# Patient Record
Sex: Female | Born: 1995 | Race: White | Hispanic: No | Marital: Single | Smoking: Never smoker
Health system: Southern US, Community
[De-identification: ages and names within clinical notes are randomized; demographics above are authoritative.]

## PROBLEM LIST (undated history)

## (undated) DIAGNOSIS — R1013 Epigastric pain: Secondary | ICD-10-CM

## (undated) DIAGNOSIS — F419 Anxiety disorder, unspecified: Secondary | ICD-10-CM

## (undated) DIAGNOSIS — J302 Other seasonal allergic rhinitis: Secondary | ICD-10-CM

## (undated) DIAGNOSIS — F444 Conversion disorder with motor symptom or deficit: Secondary | ICD-10-CM

## (undated) DIAGNOSIS — E039 Hypothyroidism, unspecified: Secondary | ICD-10-CM

## (undated) DIAGNOSIS — Z973 Presence of spectacles and contact lenses: Secondary | ICD-10-CM

## (undated) DIAGNOSIS — F32A Depression, unspecified: Secondary | ICD-10-CM

## (undated) DIAGNOSIS — R061 Stridor: Secondary | ICD-10-CM

## (undated) DIAGNOSIS — K8050 Calculus of bile duct without cholangitis or cholecystitis without obstruction: Secondary | ICD-10-CM

## (undated) DIAGNOSIS — K2970 Gastritis, unspecified, without bleeding: Secondary | ICD-10-CM

## (undated) DIAGNOSIS — F329 Major depressive disorder, single episode, unspecified: Secondary | ICD-10-CM

## (undated) HISTORY — DX: Major depressive disorder, single episode, unspecified: F32.9

## (undated) HISTORY — DX: Depression, unspecified: F32.A

## (undated) HISTORY — DX: Epigastric pain: R10.13

## (undated) HISTORY — DX: Stridor: R06.1

## (undated) HISTORY — PX: WISDOM TOOTH EXTRACTION: SHX21

## (undated) HISTORY — DX: Gastritis, unspecified, without bleeding: K29.70

## (undated) HISTORY — DX: Calculus of bile duct without cholangitis or cholecystitis without obstruction: K80.50

## (undated) HISTORY — DX: Conversion disorder with motor symptom or deficit: F44.4

## (undated) SURGERY — LAPAROSCOPIC CHOLECYSTECTOMY WITH INTRAOPERATIVE CHOLANGIOGRAM
Anesthesia: General

---
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
## 2015-03-22 ENCOUNTER — Encounter: Payer: Self-pay | Admitting: Emergency Medicine

## 2015-03-22 ENCOUNTER — Emergency Department
Admission: EM | Admit: 2015-03-22 | Discharge: 2015-03-23 | Disposition: A | Discharge status: Home / Self Care | Payer: BC Managed Care – PPO | Attending: Emergency Medicine | Admitting: Emergency Medicine

## 2015-03-22 ENCOUNTER — Emergency Department: Payer: BC Managed Care – PPO

## 2015-03-22 DIAGNOSIS — Z3202 Encounter for pregnancy test, result negative: Secondary | ICD-10-CM | POA: Diagnosis not present

## 2015-03-22 DIAGNOSIS — R1011 Right upper quadrant pain: Secondary | ICD-10-CM | POA: Diagnosis not present

## 2015-03-22 LAB — COMPREHENSIVE METABOLIC PANEL
ALBUMIN: 3.9 g/dL (ref 3.5–5.0)
ALK PHOS: 46 U/L (ref 38–126)
ALT: 14 U/L (ref 14–54)
ANION GAP: 10 (ref 5–15)
AST: 15 U/L (ref 15–41)
BUN: 13 mg/dL (ref 6–20)
CALCIUM: 9.4 mg/dL (ref 8.9–10.3)
CHLORIDE: 102 mmol/L (ref 101–111)
CO2: 25 mmol/L (ref 22–32)
CREATININE: 0.74 mg/dL (ref 0.44–1.00)
GFR calc Af Amer: >60 mL/min (ref >60)
GFR calc non Af Amer: >60 mL/min (ref >60)
GLUCOSE: 91 mg/dL (ref 65–99)
Potassium: 4 mmol/L (ref 3.5–5.1)
SODIUM: 137 mmol/L (ref 135–145)
Total Bilirubin: 0.5 mg/dL (ref 0.3–1.2)
Total Protein: 8 g/dL (ref 6.5–8.1)

## 2015-03-22 LAB — CBC
HCT: 39.7 % (ref 35.0–47.0)
HEMOGLOBIN: 13 g/dL (ref 12.0–16.0)
MCH: 28.2 pg (ref 26.0–34.0)
MCHC: 32.8 g/dL (ref 32.0–36.0)
MCV: 85.9 fL (ref 80.0–100.0)
Platelets: 335 10*3/uL (ref 150–440)
RBC: 4.62 MIL/uL (ref 3.80–5.20)
RDW: 13 % (ref 11.5–14.5)
WBC: 11.8 10*3/uL — ABNORMAL HIGH (ref 3.6–11.0)

## 2015-03-22 LAB — POCT PREGNANCY, URINE: PREG TEST UR: NEGATIVE

## 2015-03-22 LAB — URINALYSIS COMPLETE WITH MICROSCOPIC (ARMC ONLY)
Bilirubin Urine: NEGATIVE
Glucose, UA: NEGATIVE mg/dL
KETONES UR: NEGATIVE mg/dL
Nitrite: NEGATIVE
PH: 7 (ref 5.0–8.0)
PROTEIN: NEGATIVE mg/dL
Specific Gravity, Urine: 1.014 (ref 1.005–1.030)

## 2015-03-22 LAB — LIPASE, BLOOD: LIPASE: 27 U/L (ref 11–51)

## 2015-03-22 MED ORDER — METOCLOPRAMIDE HCL 5 MG/ML IJ SOLN
5.0000 mg | Freq: Once | INTRAMUSCULAR | Status: AC
  Administered 2015-03-22: 5 mg via INTRAVENOUS
  Filled 2015-03-22: qty 2

## 2015-03-22 MED ORDER — SODIUM CHLORIDE 0.9 % IV BOLUS (SEPSIS)
500.0000 mL | Freq: Once | INTRAVENOUS | Status: AC
  Administered 2015-03-22: 500 mL via INTRAVENOUS

## 2015-03-22 MED ORDER — IOHEXOL 240 MG/ML SOLN
25.0000 mL | Freq: Once | INTRAMUSCULAR | Status: AC | PRN
  Administered 2015-03-22: 25 mL via ORAL

## 2015-03-22 MED ORDER — HYDROMORPHONE HCL 1 MG/ML IJ SOLN
0.5000 mg | INTRAMUSCULAR | Status: DC | PRN
  Administered 2015-03-22: 0.5 mg via INTRAVENOUS
  Filled 2015-03-22: qty 1

## 2015-03-22 NOTE — ED Notes (Signed)
RUQ abd pain x3 hours last eaten approx 2 hour prior to pain , Nausea, denies urinary symptoms

## 2015-03-22 NOTE — ED Provider Notes (Signed)
[facility] Emergency Department Provider Note  ____________________________________________  Time seen: 2135  I have reviewed the triage vital signs and the nursing notes.  History by:  Patient  HISTORY  Chief Complaint Abdominal Pain     HPI Karen Chandler is a 20 y.o. female who reports she began to have pain in her right upper quadrant at 3 PM today. The pain has been fairly steady. She denies a nausea, vomiting, or diarrhea. She denies having had symptoms like this before. She denies any trauma and she does not have any suspicions for what may be causing this pain. She appears uncomfortable.    History reviewed. No pertinent past medical history.  There are no active problems to display for this patient.   Past Surgical History  Procedure Laterality Date  . Appendectomy      No current outpatient prescriptions on file.  Allergies Review of patient's allergies indicates no known allergies.  No family history on file.  Social History Social History  Substance Use Topics  . Smoking status: Never Smoker   . Smokeless tobacco: None  . Alcohol Use: No    Review of Systems  Constitutional: Negative for fever/chills. ENT: Negative for congestion. Cardiovascular: Negative for chest pain. Respiratory: Negative for cough. Gastrointestinal: Positive for right upper quadrant abdominal pain. See history of present illness Genitourinary: Negative for dysuria. Musculoskeletal: No back pain. Skin: Negative for rash. Neurological: Negative for headache or focal weakness   10-point ROS otherwise negative.  ____________________________________________   PHYSICAL EXAM:  VITAL SIGNS: ED Triage Vitals  Enc Vitals Group     BP 03/22/15 1808 136/81 mmHg     Pulse Rate 03/22/15 1808 88     Resp 03/22/15 1808 18     Temp 03/22/15 1808 98.3 F (36.8 C)     Temp Source 03/22/15 1808 Oral     SpO2 03/22/15 1808 100 %     Weight 03/22/15  1808 210 lb (95.255 kg)     Height 03/22/15 1808 5\' 6"  (1.676 m)     Head Cir --      Peak Flow --      Pain Score 03/22/15 1809 8     Pain Loc --      Pain Edu? --      Excl. in [place]? --     Constitutional:  Alert and oriented. Well appearing and in no distress. ENT   Head: Normocephalic and atraumatic.   Nose: No congestion/rhinnorhea.       Mouth: No erythema, no swelling   Cardiovascular: Normal rate, regular rhythm, no murmur noted Respiratory:  Normal respiratory effort, no tachypnea.    Breath sounds are clear and equal bilaterally.  Gastrointestinal: Tenderness, right upper quadrant. No distention. Normal bowel sounds. Back: No muscle spasm, no tenderness, no CVA tenderness. Musculoskeletal: No deformity noted. Nontender with normal range of motion in all extremities.  No noted edema. Neurologic:  Communicative. Normal appearing spontaneous movement in all 4 extremities. No gross focal neurologic deficits are appreciated.  Skin:  Skin is warm, dry. No rash noted. Psychiatric: Mood and affect are normal. Speech and behavior are normal.  ____________________________________________    LABS (pertinent positives/negatives)  Labs Reviewed  CBC - Abnormal; Notable for the following:    WBC 11.8 (*)    All other components within normal limits  URINALYSIS COMPLETEWITH MICROSCOPIC ([facility] ONLY) - Abnormal; Notable for the following:    Color, Urine YELLOW (*)    APPearance HAZY (*)  Hgb urine dipstick 1+ (*)    Leukocytes, UA 2+ (*)    Bacteria, UA RARE (*)    Squamous Epithelial / LPF 6-30 (*)    All other components within normal limits  LIPASE, BLOOD  COMPREHENSIVE METABOLIC PANEL  POC URINE PREG, ED  POCT PREGNANCY, URINE     ____________________________________________   EKG    ____________________________________________    RADIOLOGY  Ultrasound, right upper quadrant:  FINDINGS: Gallbladder:  No gallstones or wall thickening visualized. No  sonographic Murphy sign noted by sonographer.  Common bile duct:  Diameter: 3 mm  Liver:  No focal lesion identified. Within normal limits in parenchymal echogenicity.  IMPRESSION: Unremarkable right upper quadrant ultrasound.   ____________________________________________   PROCEDURES    ____________________________________________   INITIAL IMPRESSION / ASSESSMENT AND PLAN / ED COURSE  Pertinent labs & imaging results that were available during my care of the patient were reviewed by me and considered in my medical decision making (see chart for details).  Pleasant 20 year old female who appears uncomfortable, but attempting to be stoic. We will treat her pain and obtain an ultrasound of the right upper quadrant to evaluate for biliary disease.  ----------------------------------------- 11:15 PM on 03/22/2015 -----------------------------------------  Ultrasound is negative. No gallstones. Focal lesions.  At this time, on reexam, the patient still appears uncomfortable. She reports her pain is 7 out of 10. The pain appears little bit episodic. Given her level of discomfort, I believe a CT scan of her abdomen and pelvis is appropriate. I will order it and asked my colleague, Dr. Corky Downs to follow-up on the CT scan and reevaluate the patient.  ____________________________________________   FINAL CLINICAL IMPRESSION(S) / ED DIAGNOSES  Final diagnoses:  Abdominal pain, right upper quadrant      Ahmed Prima, MD 03/22/15 2329

## 2015-03-23 ENCOUNTER — Emergency Department: Payer: BC Managed Care – PPO

## 2015-03-23 MED ORDER — IOHEXOL 300 MG/ML  SOLN
100.0000 mL | Freq: Once | INTRAMUSCULAR | Status: AC | PRN
  Administered 2015-03-23: 100 mL via INTRAVENOUS

## 2015-03-23 MED ORDER — ONDANSETRON HCL 4 MG/2ML IJ SOLN
INTRAMUSCULAR | Status: AC
  Administered 2015-03-23: 4 mg via INTRAVENOUS
  Filled 2015-03-23: qty 2

## 2015-03-23 MED ORDER — ONDANSETRON HCL 4 MG/2ML IJ SOLN
4.0000 mg | Freq: Once | INTRAMUSCULAR | Status: AC
  Administered 2015-03-23: 4 mg via INTRAVENOUS

## 2015-03-23 NOTE — ED Notes (Signed)
Pt discharged to home.  Family member driving.  Discharge instructions reviewed.  Verbalized understanding.  No questions or concerns at this time.  Teach back verified.  Pt in NAD.  No items left in ED.   

## 2015-03-23 NOTE — Discharge Instructions (Signed)

## 2015-03-23 NOTE — ED Provider Notes (Signed)
Received patient in signout from Dr. Thomasene Lot who asked me to follow-up on the CT scan and if normal likely okay for discharge.  CT head and pelvis is unremarkable besides some moderate constipation which I discussed with the patient and her father. She is well-appearing and does report some improvement in her discomfort. Her vitals are unremarkable .We agreed that discharge home with strict return precautions is appropriate.  Lavonia Drafts, MD 03/23/15 [phone]

## 2015-03-24 LAB — URINE CULTURE

## 2015-04-01 ENCOUNTER — Other Ambulatory Visit (HOSPITAL_COMMUNITY): Payer: Self-pay | Admitting: Family Medicine

## 2015-04-01 DIAGNOSIS — R1011 Right upper quadrant pain: Secondary | ICD-10-CM

## 2015-04-03 ENCOUNTER — Ambulatory Visit
Admission: RE | Admit: 2015-04-03 | Discharge: 2015-04-03 | Disposition: A | Discharge status: Home / Self Care | Payer: BC Managed Care – PPO | Source: Ambulatory Visit | Attending: Family Medicine | Admitting: Family Medicine

## 2015-04-03 DIAGNOSIS — R110 Nausea: Secondary | ICD-10-CM | POA: Diagnosis not present

## 2015-04-03 DIAGNOSIS — R1011 Right upper quadrant pain: Secondary | ICD-10-CM | POA: Insufficient documentation

## 2015-04-03 DIAGNOSIS — R42 Dizziness and giddiness: Secondary | ICD-10-CM | POA: Diagnosis not present

## 2015-04-03 MED ORDER — TECHNETIUM TC 99M MEBROFENIN IV KIT
5.0000 | PACK | Freq: Once | INTRAVENOUS | Status: AC | PRN
  Administered 2015-04-03: 4.806 via INTRAVENOUS

## 2015-04-03 MED ORDER — SINCALIDE 5 MCG IJ SOLR
0.0200 ug/kg | Freq: Once | INTRAMUSCULAR | Status: AC
  Administered 2015-04-03: 1.9 ug via INTRAVENOUS

## 2015-04-11 ENCOUNTER — Encounter: Payer: Self-pay | Admitting: Surgery

## 2015-04-11 ENCOUNTER — Ambulatory Visit (INDEPENDENT_AMBULATORY_CARE_PROVIDER_SITE_OTHER): Payer: BC Managed Care – PPO | Admitting: Surgery

## 2015-04-11 ENCOUNTER — Other Ambulatory Visit: Payer: Self-pay

## 2015-04-11 VITALS — BP 141/90 | HR 95 | Temp 98.4°F | Ht 66.0 in | Wt 215.0 lb

## 2015-04-11 DIAGNOSIS — R1010 Upper abdominal pain, unspecified: Secondary | ICD-10-CM | POA: Diagnosis not present

## 2015-04-11 DIAGNOSIS — R1011 Right upper quadrant pain: Principal | ICD-10-CM

## 2015-04-11 DIAGNOSIS — G8929 Other chronic pain: Secondary | ICD-10-CM

## 2015-04-11 NOTE — Patient Instructions (Signed)
Please go to your Gastroenterologist appointment so we could rule out what is going on.

## 2015-04-11 NOTE — Progress Notes (Signed)
Surgical Consultation  04/11/2015  Karen Chandler is an 20 y.o. female.   CC: Right upper quadrant pain  HPI: This patient with 2 weeks of right upper quadrant pain and epigastric pain it is nearly constant but is worsened by eating it radiates through to her back and shoulder blade she has nausea but no emesis and has had normal bowel movements and no fevers or chills she's had never had an episode like this before denies jaundice or acholic stools  Patient had a HIDA scan and CCK reproduced her pain  Past Medical History  Diagnosis Date  . Depression     Past Surgical History  Procedure Laterality Date  . Appendectomy  02/22/2013    Dr. Burt Knack    Family History  Problem Relation Age of Onset  . Hypertension Mother   . Anxiety disorder Mother   . Depression Mother   . Multiple sclerosis Father   . Hypertension Maternal Grandmother   . Heart disease Maternal Grandmother   . Heart disease Maternal Grandfather   . Diabetes Paternal Grandfather     Social History:  reports that she has never smoked. She does not have any smokeless tobacco history on file. She reports that she does not drink alcohol or use illicit drugs.  Allergies: No Known Allergies  Medications reviewed.   Review of Systems:   Review of Systems  Constitutional: Negative for fever and chills.  HENT: Negative.   Eyes: Negative.   Respiratory: Negative.   Cardiovascular: Negative.   Gastrointestinal: Positive for nausea and abdominal pain. Negative for heartburn, vomiting, diarrhea, constipation, blood in stool and melena.       RUQ and back pain  Genitourinary: Negative.   Musculoskeletal: Negative.   Skin: Negative.   Neurological: Negative.   Endo/Heme/Allergies: Negative.   Psychiatric/Behavioral: Negative.      Physical Exam:  BP 141/90 mmHg  Pulse 95  Temp(Src) 98.4 F (36.9 C) (Oral)  Ht 5\' 6"  (1.676 m)  Wt 215 lb (97.523 kg)  BMI 34.72 kg/m2  LMP 03/24/2015  Physical Exam   Constitutional: She is oriented to person, place, and time and well-developed, well-nourished, and in no distress. No distress.  HENT:  Head: Normocephalic and atraumatic.  Eyes: Pupils are equal, round, and reactive to light. Right eye exhibits no discharge. Left eye exhibits no discharge. No scleral icterus.  Neck: Normal range of motion. Neck supple.  Cardiovascular: Normal rate, regular rhythm and normal heart sounds.   Pulmonary/Chest: Effort normal and breath sounds normal. No respiratory distress. She has no wheezes. She has no rales.  Abdominal: Soft. She exhibits no distension. There is no tenderness. There is no rebound and no guarding.  Musculoskeletal: Normal range of motion. She exhibits no edema.  Lymphadenopathy:    She has no cervical adenopathy.  Neurological: She is alert and oriented to person, place, and time.  Skin: Skin is warm and dry. She is not diaphoretic.  Psychiatric: Mood and affect normal.  Vitals reviewed.     No results found for this or any previous visit (from the past 48 hour(s)). No results found.  Assessment/Plan:  Is personally reviewed she has an ejection fraction of 41% but the symptoms were reproduced with CCK which is very suggestive of this being biliary colic and chronic cholecystitis. Liver in the absence of gallstones and with a normal HIDA scan ejection fraction in a young patient I believe that GI workup is warranted as well prior to offering surgery. This was discussed  with she and her father and I will arrange for GI consult and see them back and consider surgical intervention in the near future  Florene Glen, MD, FACS

## 2015-04-12 ENCOUNTER — Encounter: Payer: Self-pay | Admitting: *Deleted

## 2015-04-12 ENCOUNTER — Telehealth: Payer: Self-pay | Admitting: Surgery

## 2015-04-12 NOTE — Telephone Encounter (Signed)
Pt advised of pre op date/time and sx date. Sx: 04/25/15 with Dr Maeola Sarah Cholecystectomy. Pre op: 04/19/15 between 1-5:00pm--Phone.

## 2015-04-16 NOTE — Discharge Instructions (Signed)

## 2015-04-18 ENCOUNTER — Ambulatory Visit: Payer: BC Managed Care – PPO | Admitting: Anesthesiology

## 2015-04-18 ENCOUNTER — Ambulatory Visit
Admission: RE | Admit: 2015-04-18 | Discharge: 2015-04-18 | Disposition: A | Discharge status: Home / Self Care | Payer: BC Managed Care – PPO | Source: Ambulatory Visit | Attending: Gastroenterology | Admitting: Gastroenterology

## 2015-04-18 ENCOUNTER — Encounter
Admission: RE | Disposition: A | Discharge status: Home / Self Care | Payer: Self-pay | Source: Ambulatory Visit | Attending: Gastroenterology

## 2015-04-18 ENCOUNTER — Other Ambulatory Visit: Payer: Self-pay | Admitting: Gastroenterology

## 2015-04-18 DIAGNOSIS — F329 Major depressive disorder, single episode, unspecified: Secondary | ICD-10-CM | POA: Diagnosis not present

## 2015-04-18 DIAGNOSIS — K2950 Unspecified chronic gastritis without bleeding: Secondary | ICD-10-CM | POA: Insufficient documentation

## 2015-04-18 DIAGNOSIS — Z79899 Other long term (current) drug therapy: Secondary | ICD-10-CM | POA: Diagnosis not present

## 2015-04-18 DIAGNOSIS — R1013 Epigastric pain: Secondary | ICD-10-CM | POA: Insufficient documentation

## 2015-04-18 DIAGNOSIS — Z818 Family history of other mental and behavioral disorders: Secondary | ICD-10-CM | POA: Diagnosis not present

## 2015-04-18 DIAGNOSIS — Z820 Family history of epilepsy and other diseases of the nervous system: Secondary | ICD-10-CM | POA: Insufficient documentation

## 2015-04-18 DIAGNOSIS — J302 Other seasonal allergic rhinitis: Secondary | ICD-10-CM | POA: Insufficient documentation

## 2015-04-18 DIAGNOSIS — Z9889 Other specified postprocedural states: Secondary | ICD-10-CM | POA: Diagnosis not present

## 2015-04-18 DIAGNOSIS — K2970 Gastritis, unspecified, without bleeding: Secondary | ICD-10-CM | POA: Insufficient documentation

## 2015-04-18 DIAGNOSIS — Z833 Family history of diabetes mellitus: Secondary | ICD-10-CM | POA: Diagnosis not present

## 2015-04-18 DIAGNOSIS — Z7951 Long term (current) use of inhaled steroids: Secondary | ICD-10-CM | POA: Diagnosis not present

## 2015-04-18 DIAGNOSIS — Z8249 Family history of ischemic heart disease and other diseases of the circulatory system: Secondary | ICD-10-CM | POA: Diagnosis not present

## 2015-04-18 HISTORY — DX: Presence of spectacles and contact lenses: Z97.3

## 2015-04-18 HISTORY — DX: Other seasonal allergic rhinitis: J30.2

## 2015-04-18 HISTORY — PX: ESOPHAGOGASTRODUODENOSCOPY (EGD) WITH PROPOFOL: SHX5813

## 2015-04-18 SURGERY — ESOPHAGOGASTRODUODENOSCOPY (EGD) WITH PROPOFOL
Anesthesia: Monitor Anesthesia Care

## 2015-04-18 MED ORDER — ACETAMINOPHEN 325 MG PO TABS: 325.0000 mg | ORAL_TABLET | ORAL | Status: DC | PRN

## 2015-04-18 MED ORDER — PROPOFOL 10 MG/ML IV BOLUS
INTRAVENOUS | Status: DC | PRN
  Administered 2015-04-18 (×4): 20 mg via INTRAVENOUS

## 2015-04-18 MED ORDER — LACTATED RINGERS IV SOLN
INTRAVENOUS | Status: DC
  Administered 2015-04-18: 09:00:00 via INTRAVENOUS

## 2015-04-18 MED ORDER — ONDANSETRON HCL 4 MG/2ML IJ SOLN: 4.0000 mg | Freq: Once | INTRAMUSCULAR | Status: DC | PRN

## 2015-04-18 MED ORDER — ACETAMINOPHEN 160 MG/5ML PO SOLN: 325.0000 mg | ORAL | Status: DC | PRN

## 2015-04-18 SURGICAL SUPPLY — 39 items
BALLN DILATOR 10-12 8 (BALLOONS)
BALLN DILATOR 12-15 8 (BALLOONS)
BALLN DILATOR 15-18 8 (BALLOONS)
BALLN DILATOR CRE 0-12 8 (BALLOONS)
BALLN DILATOR ESOPH 8 10 CRE (MISCELLANEOUS) IMPLANT
BALLOON DILATOR 12-15 8 (BALLOONS) IMPLANT
BALLOON DILATOR 15-18 8 (BALLOONS) IMPLANT
BALLOON DILATOR CRE 0-12 8 (BALLOONS) IMPLANT
BLOCK BITE 60FR ADLT L/F GRN (MISCELLANEOUS) ×3 IMPLANT
CANISTER SUCT 1200ML W/VALVE (MISCELLANEOUS) ×3 IMPLANT
FCP ESCP3.2XJMB 240X2.8X (MISCELLANEOUS) ×1
FORCEPS BIOP RAD 4 LRG CAP 4 (CUTTING FORCEPS) IMPLANT
FORCEPS BIOP RJ4 240 W/NDL (MISCELLANEOUS) ×2
FORCEPS ESCP3.2XJMB 240X2.8X (MISCELLANEOUS) ×1 IMPLANT
GOWN CVR UNV OPN BCK APRN NK (MISCELLANEOUS) ×2 IMPLANT
GOWN ISOL THUMB LOOP REG UNIV (MISCELLANEOUS) ×4
HEMOCLIP INSTINCT (CLIP) IMPLANT
INJECTOR VARIJECT VIN23 (MISCELLANEOUS) IMPLANT
KIT CO2 TUBING (TUBING) IMPLANT
KIT DEFENDO VALVE AND CONN (KITS) IMPLANT
KIT ENDO PROCEDURE OLY (KITS) ×3 IMPLANT
LIGATOR MULTIBAND 6SHOOTER MBL (MISCELLANEOUS) IMPLANT
MARKER SPOT ENDO TATTOO 5ML (MISCELLANEOUS) IMPLANT
PAD GROUND ADULT SPLIT (MISCELLANEOUS) IMPLANT
SNARE SHORT THROW 13M SML OVAL (MISCELLANEOUS) IMPLANT
SNARE SHORT THROW 30M LRG OVAL (MISCELLANEOUS) IMPLANT
SPOT EX ENDOSCOPIC TATTOO (MISCELLANEOUS)
SUCTION POLY TRAP 4CHAMBER (MISCELLANEOUS) IMPLANT
SYR INFLATION 60ML (SYRINGE) IMPLANT
TRAP SUCTION POLY (MISCELLANEOUS) IMPLANT
TUBING CONN 6MMX3.1M (TUBING)
TUBING SUCTION CONN 0.25 STRL (TUBING) IMPLANT
UNDERPAD 30X60 958B10 (PK) (MISCELLANEOUS) IMPLANT
VALVE BIOPSY ENDO (VALVE) IMPLANT
VARIJECT INJECTOR VIN23 (MISCELLANEOUS)
WATER AUXILLARY (MISCELLANEOUS) IMPLANT
WATER STERILE IRR 250ML POUR (IV SOLUTION) ×3 IMPLANT
WATER STERILE IRR 500ML POUR (IV SOLUTION) IMPLANT
WIRE CRE 18-20MM 8CM F G (MISCELLANEOUS) IMPLANT

## 2015-04-18 NOTE — Transfer of Care (Signed)
Immediate Anesthesia Transfer of Care Note  Patient: Karen Chandler  Procedure(s) Performed: Procedure(s): ESOPHAGOGASTRODUODENOSCOPY (EGD) WITH PROPOFOL (N/A)  Patient Location: PACU  Anesthesia Type: MAC  Level of Consciousness: awake, alert  and patient cooperative  Airway and Oxygen Therapy: Patient Spontanous Breathing and Patient connected to supplemental oxygen  Post-op Assessment: Post-op Vital signs reviewed, Patient's Cardiovascular Status Stable, Respiratory Function Stable, Patent Airway and No signs of Nausea or vomiting  Post-op Vital Signs: Reviewed and stable  Complications: No apparent anesthesia complications

## 2015-04-18 NOTE — H&P (Signed)
  [facility]  [address] [place], [place] [address] Phone: [phone] Fax : [phone]  Primary Care Physician:  Wynelle Fanny Primary Gastroenterologist:  Dr. Allen Norris  Pre-Procedure History & Physical: HPI:  Karen Chandler is a 20 y.o. female is here for an endoscopy.   Past Medical History  Diagnosis Date  . Depression   . Seasonal allergies   . Wears contact lenses     Past Surgical History  Procedure Laterality Date  . Appendectomy  02/22/2013    Dr. Burt Knack  . Wisdom tooth extraction      Prior to Admission medications   Medication Sig Start Date End Date Taking? Authorizing Provider  citalopram (CELEXA) 40 MG tablet Take 1 tablet by mouth 1 day or 1 dose. 03/25/15  Yes Historical Provider, MD  fluticasone (FLONASE) 50 MCG/ACT nasal spray Place 2 sprays into the nose. 04/20/14  Yes Historical Provider, MD  loratadine (CLARITIN) 10 MG tablet Take 10 mg by mouth daily.   Yes Historical Provider, MD  Norgestimate-Ethinyl Estradiol Triphasic 0.18/0.215/0.25 MG-35 MCG tablet Take 1 tablet by mouth.   Yes Historical Provider, MD    Allergies as of 04/11/2015  . (No Known Allergies)    Family History  Problem Relation Age of Onset  . Hypertension Mother   . Anxiety disorder Mother   . Depression Mother   . Multiple sclerosis Father   . Hypertension Maternal Grandmother   . Heart disease Maternal Grandmother   . Heart disease Maternal Grandfather   . Diabetes Paternal Grandfather     Social History   Social History  . Marital Status: Single    Spouse Name: N/A  . Number of Children: N/A  . Years of Education: N/A   Occupational History  . Not on file.   Social History Main Topics  . Smoking status: Never Smoker   . Smokeless tobacco: Not on file  . Alcohol Use: No  . Drug Use: No  . Sexual Activity: Not on file   Other Topics Concern  . Not on file   Social History Narrative    Review of Systems: See HPI,  otherwise negative ROS  Physical Exam: BP 115/72 mmHg  Pulse 91  Temp(Src) 99 F (37.2 C) (Temporal)  Resp 16  Ht 5\' 6"  (1.676 m)  Wt 214 lb (97.07 kg)  BMI 34.56 kg/m2  SpO2 100%  LMP 03/24/2015 (Approximate) General:   Alert,  pleasant and cooperative in NAD Head:  Normocephalic and atraumatic. Neck:  Supple; no masses or thyromegaly. Lungs:  Clear throughout to auscultation.    Heart:  Regular rate and rhythm. Abdomen:  Soft, nontender and nondistended. Normal bowel sounds, without guarding, and without rebound.   Neurologic:  Alert and  oriented x4;  grossly normal neurologically.  Impression/Plan: Karen Chandler is here for an endoscopy to be performed for epigastric pain  Risks, benefits, limitations, and alternatives regarding  endoscopy have been reviewed with the patient.  Questions have been answered.  All parties agreeable.   Ollen Bowl, MD  04/18/2015, 9:04 AM

## 2015-04-18 NOTE — Anesthesia Postprocedure Evaluation (Signed)
Anesthesia Post Note  Patient: Karen Chandler  Procedure(s) Performed: Procedure(s) (LRB): ESOPHAGOGASTRODUODENOSCOPY (EGD) WITH PROPOFOL (N/A)  Patient location during evaluation: PACU Anesthesia Type: MAC Level of consciousness: awake and alert and oriented Pain management: pain level controlled Vital Signs Assessment: post-procedure vital signs reviewed and stable Respiratory status: spontaneous breathing and nonlabored ventilation Cardiovascular status: stable Postop Assessment: no signs of nausea or vomiting and adequate PO intake Anesthetic complications: no    Estill Batten

## 2015-04-18 NOTE — Anesthesia Preprocedure Evaluation (Signed)
Anesthesia Evaluation  Patient identified by MRN, date of birth, ID band Patient awake    Reviewed: Allergy & Precautions, NPO status , Patient's Chart, lab work & pertinent test results, reviewed documented beta blocker date and time   Airway Mallampati: II  TM Distance: >3 FB Neck ROM: Full    Dental no notable dental hx.    Pulmonary neg pulmonary ROS,    Pulmonary exam normal        Cardiovascular negative cardio ROS Normal cardiovascular exam     Neuro/Psych PSYCHIATRIC DISORDERS Depression negative neurological ROS     GI/Hepatic negative GI ROS, Neg liver ROS,   Endo/Other  negative endocrine ROS  Renal/GU      Musculoskeletal negative musculoskeletal ROS (+)   Abdominal   Peds negative pediatric ROS (+)  Hematology negative hematology ROS (+)   Anesthesia Other Findings   Reproductive/Obstetrics                             Anesthesia Physical Anesthesia Plan  ASA: I  Anesthesia Plan: MAC   Post-op Pain Management:    Induction: Intravenous  Airway Management Planned:   Additional Equipment:   Intra-op Plan:   Post-operative Plan:   Informed Consent: I have reviewed the patients History and Physical, chart, labs and discussed the procedure including the risks, benefits and alternatives for the proposed anesthesia with the patient or authorized representative who has indicated his/her understanding and acceptance.     Plan Discussed with: CRNA  Anesthesia Plan Comments:         Anesthesia Quick Evaluation

## 2015-04-18 NOTE — Op Note (Signed)
[facility] Patient Name: Karen Chandler Procedure Date: 04/18/2015 9:51 AM MRN: [id number] Account #: [id number] Date of Birth: [birth year] Admit Type: Outpatient Age: 20 Room: [facility] OR ROOM 01 Gender: Female Note Status: Finalized Procedure:         Upper GI endoscopy Indications:       Epigastric abdominal pain Providers:         Lucilla Lame, MD Medicines:         Propofol per Anesthesia Complications:     No immediate complications. Procedure:         Pre-Anesthesia Assessment:                    - Prior to the procedure, a History and Physical was                     performed, and patient medications and allergies were                     reviewed. The patient's tolerance of previous anesthesia                     was also reviewed. The risks and benefits of the procedure                     and the sedation options and risks were discussed with the                     patient. All questions were answered, and informed consent                     was obtained. Prior Anticoagulants: The patient has taken                     no previous anticoagulant or antiplatelet agents. ASA                     Grade Assessment: II - A patient with mild systemic                     disease. After reviewing the risks and benefits, the                     patient was deemed in satisfactory condition to undergo                     the procedure.                    After obtaining informed consent, the endoscope was passed                     under direct vision. Throughout the procedure, the                     patient's blood pressure, pulse, and oxygen saturations                     were monitored continuously. The was introduced through                     the mouth, and advanced to the second part of duodenum.                     The upper GI endoscopy was accomplished  without                     difficulty. The patient tolerated the procedure  well. Findings:      The examined esophagus was normal.      Localized mild inflammation characterized by erythema was found in the       gastric antrum. Biopsies were taken with a cold forceps for histology.      The examined duodenum was normal. Impression:        - Normal esophagus.                    - Gastritis. Biopsied.                    - Normal examined duodenum. Recommendation:    - Await pathology results. Procedure Code(s): --- Professional ---                    [id number], Esophagogastroduodenoscopy, flexible, transoral;                     with biopsy, single or multiple Diagnosis Code(s): --- Professional ---                    R10.13, Epigastric pain                    K29.70, Gastritis, unspecified, without bleeding CPT copyright 2014 American Medical Association. All rights reserved. The codes documented in this report are preliminary and upon coder review may  be revised to meet current compliance requirements. Lucilla Lame, MD 04/18/2015 10:03:22 AM This report has been signed electronically. Number of Addenda: 0 Note Initiated On: 04/18/2015 9:51 AM Total Procedure Duration: 0 hours 2 minutes 21 seconds       [facility]

## 2015-04-18 NOTE — Anesthesia Procedure Notes (Signed)
Procedure Name: MAC Performed by: Hollie Bartus Pre-anesthesia Checklist: Patient identified, Emergency Drugs available, Suction available, Patient being monitored and Timeout performed Patient Re-evaluated:Patient Re-evaluated prior to inductionOxygen Delivery Method: Nasal cannula Preoxygenation: Pre-oxygenation with 100% oxygen     

## 2015-04-19 ENCOUNTER — Other Ambulatory Visit: Payer: BC Managed Care – PPO

## 2015-04-19 ENCOUNTER — Encounter: Payer: Self-pay | Admitting: Gastroenterology

## 2015-04-19 NOTE — Patient Instructions (Signed)
  Your procedure is scheduled on: 04-25-15 Report to [facility] To find out your arrival time please call [phone] between 1PM - 3PM on 04-24-15  Remember: Instructions that are not followed completely may result in serious medical risk, up to and including death, or upon the discretion of your surgeon and anesthesiologist your surgery may need to be rescheduled.    _X___ 1. Do not eat food or drink liquids after midnight. No gum chewing or hard candies.     _X___ 2. No Alcohol for 24 hours before or after surgery.   ____ 3. Bring all medications with you on the day of surgery if instructed.    ____ 4. Notify your doctor if there is any change in your medical condition     (cold, fever, infections).     Do not wear jewelry, make-up, hairpins, clips or nail polish.  Do not wear lotions, powders, or perfumes. You may wear deodorant.  Do not shave 48 hours prior to surgery. Men may shave face and neck.  Do not bring valuables to the hospital.    [facility] is not responsible for any belongings or valuables.               Contacts, dentures or bridgework may not be worn into surgery.  Leave your suitcase in the car. After surgery it may be brought to your room.  For patients admitted to the hospital, discharge time is determined by your treatment team.   Patients discharged the day of surgery will not be allowed to drive home.   Please read over the following fact sheets that you were given:      ____ Take these medicines the morning of surgery with A SIP OF WATER:    1. NONE  2.   3.   4.  5.  6.  ____ Fleet Enema (as directed)   ____ Use CHG Soap as directed  ____ Use inhalers on the day of surgery  ____ Stop metformin 2 days prior to surgery    ____ Take 1/2 of usual insulin dose the night before surgery and none on the morning of surgery.   ____ Stop Coumadin/Plavix/aspirin-N/A  ____ Stop Anti-inflammatories-NO NSAIDS OR ASA  PRODUCTS-TYLENOL OK   ____ Stop supplements until after surgery.    ____ Bring C-Pap to the hospital.

## 2015-04-22 ENCOUNTER — Encounter: Payer: Self-pay | Admitting: Gastroenterology

## 2015-04-24 ENCOUNTER — Ambulatory Visit (INDEPENDENT_AMBULATORY_CARE_PROVIDER_SITE_OTHER): Payer: BC Managed Care – PPO | Admitting: Surgery

## 2015-04-24 ENCOUNTER — Encounter: Payer: Self-pay | Admitting: Surgery

## 2015-04-24 VITALS — BP 122/85 | HR 105 | Temp 98.0°F | Wt 218.0 lb

## 2015-04-24 DIAGNOSIS — K8050 Calculus of bile duct without cholangitis or cholecystitis without obstruction: Secondary | ICD-10-CM

## 2015-04-24 DIAGNOSIS — R1011 Right upper quadrant pain: Secondary | ICD-10-CM

## 2015-04-24 DIAGNOSIS — G8929 Other chronic pain: Secondary | ICD-10-CM | POA: Diagnosis not present

## 2015-04-24 DIAGNOSIS — R1010 Upper abdominal pain, unspecified: Secondary | ICD-10-CM | POA: Diagnosis not present

## 2015-04-24 MED ORDER — OXYCODONE-ACETAMINOPHEN 5-325 MG PO TABS: 1.0000 | ORAL_TABLET | ORAL | Status: DC | PRN

## 2015-04-24 NOTE — Patient Instructions (Signed)
Good luck with your surgery tomorrow! :)

## 2015-04-24 NOTE — Progress Notes (Signed)
Outpatient Surgical Follow Up  04/24/2015  Karen Chandler is an 20 y.o. female.   CC:RUQ pain  HPI: A 20 year old female patient with ongoing right upper quadrant pain is episodic in nature often associated with oral intake but not always associated with fatty meals. He has had a workup including a HIDA scan with [facility] which reproduced her pain identically only worse. She's had no jaundice or acholic stools no fevers or chills. Her workup with Dr. Allen Norris included an EGD. I spoke to him concerning his findings of mild gastritis but he believes that her symptoms are related to the gallbladder and that she warrants laparoscopic cholecystectomy. This was a personal conversation by telephone with Dr. Allen Norris.  Past Medical History  Diagnosis Date  . Depression   . Seasonal allergies   . Wears contact lenses     Past Surgical History  Procedure Laterality Date  . Appendectomy  02/22/2013    Dr. Burt Knack  . Wisdom tooth extraction    . Esophagogastroduodenoscopy (egd) with propofol N/A 04/18/2015    Procedure: ESOPHAGOGASTRODUODENOSCOPY (EGD) WITH PROPOFOL;  Surgeon: Lucilla Lame, MD;  Location: [facility];  Service: Endoscopy;  Laterality: N/A;    Family History  Problem Relation Age of Onset  . Hypertension Mother   . Anxiety disorder Mother   . Depression Mother   . Multiple sclerosis Father   . Hypertension Maternal Grandmother   . Heart disease Maternal Grandmother   . Heart disease Maternal Grandfather   . Diabetes Paternal Grandfather     Social History:  reports that she has never smoked. She does not have any smokeless tobacco history on file. She reports that she does not drink alcohol or use illicit drugs.  Allergies: No Known Allergies  Medications reviewed.   Review of Systems:   Review of Systems  Constitutional: Negative for fever and chills.  HENT: Negative.   Eyes: Negative.   Respiratory: Negative.   Cardiovascular: Negative.   Gastrointestinal: Positive  for nausea and abdominal pain. Negative for heartburn, vomiting, diarrhea, constipation, blood in stool and melena.  Genitourinary: Negative.   Musculoskeletal: Negative.   Skin: Negative for itching and rash.  Neurological: Negative.   Endo/Heme/Allergies: Negative.   Psychiatric/Behavioral: Negative.      Physical Exam:  BP 122/85 mmHg  Pulse 105  Temp(Src) 98 F (36.7 C) (Oral)  Wt 218 lb (98.884 kg)  Physical Exam  Constitutional: She is oriented to person, place, and time and well-developed, well-nourished, and in no distress. No distress.  HENT:  Head: Normocephalic and atraumatic.  Eyes: Pupils are equal, round, and reactive to light. Right eye exhibits no discharge. Left eye exhibits no discharge. No scleral icterus.  Neck: Normal range of motion.  Cardiovascular: Normal rate, regular rhythm and normal heart sounds.   Pulmonary/Chest: Effort normal and breath sounds normal. No respiratory distress. She has no wheezes. She has no rales.  Abdominal: Soft. She exhibits no distension. There is tenderness. There is no rebound and no guarding.  Tender in the right upper quadrant  Musculoskeletal: Normal range of motion. She exhibits no edema or tenderness.  Lymphadenopathy:    She has no cervical adenopathy.  Neurological: She is alert and oriented to person, place, and time. Gait normal.  Skin: Skin is warm. No rash noted. She is not diaphoretic. No erythema.  Psychiatric: Mood and affect normal.  Vitals reviewed.     No results found for this or any previous visit (from the past 48 hour(s)). No results  found.  Assessment/Plan:  This a patient with likely biliary colic warrants laparoscopic cholecystectomy her workup has included a HIDA scan with the above findings as well as an EGD with the above findings and she has been on acid reducing medications which has not improved her symptoms. I'm offering laparoscopic cholecystectomy for control of her symptoms. The options  of observation of been reviewed the risks of bleeding infection recurrence failure to resolve her symptoms conversion to an open procedure bile duct damage bile duct leak or bowel injury and he which could require further surgery and/or ERCP stent papillotomy with all been discussed she and her father understood and agreed to proceed  Florene Glen, MD, FACS

## 2015-04-25 ENCOUNTER — Ambulatory Visit
Admission: RE | Admit: 2015-04-25 | Discharge: 2015-04-25 | Disposition: A | Discharge status: Home / Self Care | Payer: BC Managed Care – PPO | Source: Ambulatory Visit | Attending: Surgery | Admitting: Surgery

## 2015-04-25 ENCOUNTER — Ambulatory Visit: Payer: BC Managed Care – PPO | Admitting: Anesthesiology

## 2015-04-25 ENCOUNTER — Encounter
Admission: RE | Disposition: A | Discharge status: Home / Self Care | Payer: Self-pay | Source: Ambulatory Visit | Attending: Surgery

## 2015-04-25 ENCOUNTER — Encounter: Payer: Self-pay | Admitting: *Deleted

## 2015-04-25 DIAGNOSIS — Z9889 Other specified postprocedural states: Secondary | ICD-10-CM | POA: Insufficient documentation

## 2015-04-25 DIAGNOSIS — K8050 Calculus of bile duct without cholangitis or cholecystitis without obstruction: Secondary | ICD-10-CM | POA: Insufficient documentation

## 2015-04-25 DIAGNOSIS — K828 Other specified diseases of gallbladder: Secondary | ICD-10-CM | POA: Diagnosis not present

## 2015-04-25 DIAGNOSIS — Z820 Family history of epilepsy and other diseases of the nervous system: Secondary | ICD-10-CM | POA: Insufficient documentation

## 2015-04-25 DIAGNOSIS — Z818 Family history of other mental and behavioral disorders: Secondary | ICD-10-CM | POA: Diagnosis not present

## 2015-04-25 DIAGNOSIS — Z8249 Family history of ischemic heart disease and other diseases of the circulatory system: Secondary | ICD-10-CM | POA: Insufficient documentation

## 2015-04-25 DIAGNOSIS — Z7951 Long term (current) use of inhaled steroids: Secondary | ICD-10-CM | POA: Diagnosis not present

## 2015-04-25 DIAGNOSIS — Z793 Long term (current) use of hormonal contraceptives: Secondary | ICD-10-CM | POA: Insufficient documentation

## 2015-04-25 DIAGNOSIS — Z833 Family history of diabetes mellitus: Secondary | ICD-10-CM | POA: Insufficient documentation

## 2015-04-25 DIAGNOSIS — J302 Other seasonal allergic rhinitis: Secondary | ICD-10-CM | POA: Diagnosis not present

## 2015-04-25 DIAGNOSIS — F329 Major depressive disorder, single episode, unspecified: Secondary | ICD-10-CM | POA: Diagnosis not present

## 2015-04-25 DIAGNOSIS — Z9049 Acquired absence of other specified parts of digestive tract: Secondary | ICD-10-CM | POA: Insufficient documentation

## 2015-04-25 DIAGNOSIS — Z79899 Other long term (current) drug therapy: Secondary | ICD-10-CM | POA: Diagnosis not present

## 2015-04-25 DIAGNOSIS — K811 Chronic cholecystitis: Secondary | ICD-10-CM | POA: Diagnosis not present

## 2015-04-25 HISTORY — PX: CHOLECYSTECTOMY: SHX55

## 2015-04-25 LAB — POCT PREGNANCY, URINE: Preg Test, Ur: NEGATIVE

## 2015-04-25 SURGERY — LAPAROSCOPIC CHOLECYSTECTOMY WITH INTRAOPERATIVE CHOLANGIOGRAM
Anesthesia: General | Wound class: Clean Contaminated

## 2015-04-25 MED ORDER — LACTATED RINGERS IV SOLN
INTRAVENOUS | Status: DC
Start: 2015-04-25 — End: 2015-04-25
  Administered 2015-04-25: 11:00:00 via INTRAVENOUS

## 2015-04-25 MED ORDER — HEPARIN SODIUM (PORCINE) 5000 UNIT/ML IJ SOLN
INTRAMUSCULAR | Status: AC
  Administered 2015-04-25: 5000 [IU] via SUBCUTANEOUS
  Filled 2015-04-25: qty 1

## 2015-04-25 MED ORDER — DEXAMETHASONE SODIUM PHOSPHATE 10 MG/ML IJ SOLN
INTRAMUSCULAR | Status: DC | PRN
  Administered 2015-04-25: 10 mg via INTRAVENOUS

## 2015-04-25 MED ORDER — ONDANSETRON HCL 4 MG/2ML IJ SOLN
INTRAMUSCULAR | Status: AC
  Administered 2015-04-25: 4 mg via INTRAVENOUS
  Filled 2015-04-25: qty 2

## 2015-04-25 MED ORDER — SODIUM CHLORIDE 0.9 % IJ SOLN
INTRAMUSCULAR | Status: AC
  Filled 2015-04-25: qty 10

## 2015-04-25 MED ORDER — MIDAZOLAM HCL 2 MG/2ML IJ SOLN
INTRAMUSCULAR | Status: DC | PRN
  Administered 2015-04-25: 2 mg via INTRAVENOUS

## 2015-04-25 MED ORDER — FENTANYL CITRATE (PF) 100 MCG/2ML IJ SOLN
INTRAMUSCULAR | Status: DC | PRN
  Administered 2015-04-25: 100 ug via INTRAVENOUS
  Administered 2015-04-25: 50 ug via INTRAVENOUS
  Administered 2015-04-25: 100 ug via INTRAVENOUS

## 2015-04-25 MED ORDER — CEFAZOLIN SODIUM-DEXTROSE 2-3 GM-% IV SOLR
INTRAVENOUS | Status: AC
  Administered 2015-04-25: 2 g via INTRAVENOUS
  Filled 2015-04-25: qty 50

## 2015-04-25 MED ORDER — CEFAZOLIN SODIUM-DEXTROSE 2-3 GM-% IV SOLR
2.0000 g | INTRAVENOUS | Status: AC
  Administered 2015-04-25: 2 g via INTRAVENOUS

## 2015-04-25 MED ORDER — FENTANYL CITRATE (PF) 100 MCG/2ML IJ SOLN
INTRAMUSCULAR | Status: AC
  Administered 2015-04-25: 25 ug via INTRAVENOUS
  Filled 2015-04-25: qty 2

## 2015-04-25 MED ORDER — NEOSTIGMINE METHYLSULFATE 10 MG/10ML IV SOLN
INTRAVENOUS | Status: DC | PRN
  Administered 2015-04-25: 5 mg via INTRAVENOUS

## 2015-04-25 MED ORDER — PROMETHAZINE HCL 25 MG/ML IJ SOLN
6.2500 mg | Freq: Once | INTRAMUSCULAR | Status: AC
  Administered 2015-04-25: 6.25 mg via INTRAVENOUS

## 2015-04-25 MED ORDER — HEPARIN SODIUM (PORCINE) 5000 UNIT/ML IJ SOLN
5000.0000 [IU] | Freq: Once | INTRAMUSCULAR | Status: AC
  Administered 2015-04-25: 5000 [IU] via SUBCUTANEOUS

## 2015-04-25 MED ORDER — FAMOTIDINE 20 MG PO TABS
ORAL_TABLET | ORAL | Status: AC
  Administered 2015-04-25: 20 mg via ORAL
  Filled 2015-04-25: qty 1

## 2015-04-25 MED ORDER — PROMETHAZINE HCL 25 MG/ML IJ SOLN
INTRAMUSCULAR | Status: AC
  Filled 2015-04-25: qty 1

## 2015-04-25 MED ORDER — ONDANSETRON HCL 4 MG/2ML IJ SOLN
INTRAMUSCULAR | Status: DC | PRN
Start: 2015-04-25 — End: 2015-04-25
  Administered 2015-04-25: 4 mg via INTRAVENOUS

## 2015-04-25 MED ORDER — ONDANSETRON HCL 4 MG/2ML IJ SOLN
4.0000 mg | Freq: Once | INTRAMUSCULAR | Status: AC | PRN
  Administered 2015-04-25: 4 mg via INTRAVENOUS

## 2015-04-25 MED ORDER — SODIUM CHLORIDE 0.9 % IJ SOLN
INTRAMUSCULAR | Status: DC
Start: 2015-04-25 — End: 2015-04-25
  Filled 2015-04-25: qty 10

## 2015-04-25 MED ORDER — PROMETHAZINE HCL 25 MG/ML IJ SOLN
12.5000 mg | Freq: Once | INTRAMUSCULAR | Status: AC
  Administered 2015-04-25: 12.5 mg via INTRAVENOUS

## 2015-04-25 MED ORDER — OXYCODONE-ACETAMINOPHEN 5-325 MG PO TABS
ORAL_TABLET | ORAL | Status: AC
  Filled 2015-04-25: qty 1

## 2015-04-25 MED ORDER — FAMOTIDINE 20 MG PO TABS
20.0000 mg | ORAL_TABLET | Freq: Once | ORAL | Status: AC
  Administered 2015-04-25: 20 mg via ORAL

## 2015-04-25 MED ORDER — GLYCOPYRROLATE 0.2 MG/ML IJ SOLN
INTRAMUSCULAR | Status: DC | PRN
  Administered 2015-04-25: 0.6 mg via INTRAVENOUS

## 2015-04-25 MED ORDER — PROMETHAZINE HCL 25 MG/ML IJ SOLN
INTRAMUSCULAR | Status: AC
  Administered 2015-04-25: 12.5 mg via INTRAVENOUS
  Filled 2015-04-25: qty 1

## 2015-04-25 MED ORDER — FENTANYL CITRATE (PF) 100 MCG/2ML IJ SOLN
25.0000 ug | INTRAMUSCULAR | Status: AC | PRN
  Administered 2015-04-25 (×6): 25 ug via INTRAVENOUS

## 2015-04-25 MED ORDER — PROPOFOL 10 MG/ML IV BOLUS
INTRAVENOUS | Status: DC | PRN
  Administered 2015-04-25: 200 mg via INTRAVENOUS

## 2015-04-25 MED ORDER — BUPIVACAINE-EPINEPHRINE (PF) 0.25% -1:200000 IJ SOLN
INTRAMUSCULAR | Status: DC | PRN
  Administered 2015-04-25: 30 mL via PERINEURAL

## 2015-04-25 MED ORDER — ROCURONIUM BROMIDE 100 MG/10ML IV SOLN
INTRAVENOUS | Status: DC | PRN
  Administered 2015-04-25: 10 mg via INTRAVENOUS
  Administered 2015-04-25: 40 mg via INTRAVENOUS

## 2015-04-25 MED ORDER — BUPIVACAINE-EPINEPHRINE (PF) 0.25% -1:200000 IJ SOLN
INTRAMUSCULAR | Status: AC
  Filled 2015-04-25: qty 30

## 2015-04-25 MED ORDER — KETOROLAC TROMETHAMINE 30 MG/ML IJ SOLN
INTRAMUSCULAR | Status: DC | PRN
  Administered 2015-04-25: 30 mg via INTRAVENOUS

## 2015-04-25 MED ORDER — OXYCODONE-ACETAMINOPHEN 5-325 MG PO TABS
1.0000 | ORAL_TABLET | Freq: Once | ORAL | Status: AC
  Administered 2015-04-25: 1 via ORAL

## 2015-04-25 SURGICAL SUPPLY — 43 items
ADHESIVE MASTISOL STRL (MISCELLANEOUS) ×3 IMPLANT
APPLIER CLIP ROT 10 11.4 M/L (STAPLE)
BLADE SURG SZ11 CARB STEEL (BLADE) ×3 IMPLANT
CANISTER SUCT 1200ML W/VALVE (MISCELLANEOUS) ×3 IMPLANT
CATH CHOLANGI 4FR 420404F (CATHETERS) IMPLANT
CHLORAPREP W/TINT 26ML (MISCELLANEOUS) ×3 IMPLANT
CLIP APPLIE ROT 10 11.4 M/L (STAPLE) IMPLANT
CLOSURE WOUND 1/2 X4 (GAUZE/BANDAGES/DRESSINGS) ×1
CONRAY 60ML FOR OR (MISCELLANEOUS) IMPLANT
DRAPE C-ARM XRAY 36X54 (DRAPES) IMPLANT
ELECT REM PT RETURN 9FT ADLT (ELECTROSURGICAL) ×3
ELECTRODE REM PT RTRN 9FT ADLT (ELECTROSURGICAL) ×1 IMPLANT
ENDOPOUCH RETRIEVER 10 (MISCELLANEOUS) ×3 IMPLANT
GAUZE SPONGE NON-WVN 2X2 STRL (MISCELLANEOUS) ×4 IMPLANT
GLOVE BIO SURGEON STRL SZ8 (GLOVE) ×9 IMPLANT
GOWN STRL REUS W/ TWL LRG LVL3 (GOWN DISPOSABLE) ×4 IMPLANT
GOWN STRL REUS W/TWL LRG LVL3 (GOWN DISPOSABLE) ×8
IRRIGATION STRYKERFLOW (MISCELLANEOUS) ×1 IMPLANT
IRRIGATOR STRYKERFLOW (MISCELLANEOUS) ×3
IV CATH ANGIO 12GX3 LT BLUE (NEEDLE) IMPLANT
IV NS 1000ML (IV SOLUTION)
IV NS 1000ML BAXH (IV SOLUTION) IMPLANT
JACKSON PRATT 10 (INSTRUMENTS) IMPLANT
KIT RM TURNOVER STRD PROC AR (KITS) ×3 IMPLANT
LABEL OR SOLS (LABEL) ×3 IMPLANT
NDL SAFETY 22GX1.5 (NEEDLE) ×3 IMPLANT
NEEDLE VERESS 14GA 120MM (NEEDLE) ×3 IMPLANT
NS IRRIG 500ML POUR BTL (IV SOLUTION) ×3 IMPLANT
PACK LAP CHOLECYSTECTOMY (MISCELLANEOUS) ×3 IMPLANT
SCISSORS METZENBAUM CVD 33 (INSTRUMENTS) ×3 IMPLANT
SLEEVE ENDOPATH XCEL 5M (ENDOMECHANICALS) ×6 IMPLANT
SPONGE EXCIL AMD DRAIN 4X4 6P (MISCELLANEOUS) IMPLANT
SPONGE LAP 18X18 5 PK (GAUZE/BANDAGES/DRESSINGS) ×3 IMPLANT
SPONGE VERSALON 2X2 STRL (MISCELLANEOUS) ×8
STRIP CLOSURE SKIN 1/2X4 (GAUZE/BANDAGES/DRESSINGS) ×2 IMPLANT
SUT MNCRL 4-0 (SUTURE) ×2
SUT MNCRL 4-0 27XMFL (SUTURE) ×1
SUT VICRYL 0 AB UR-6 (SUTURE) ×3 IMPLANT
SUTURE MNCRL 4-0 27XMF (SUTURE) ×1 IMPLANT
SYR 20CC LL (SYRINGE) ×3 IMPLANT
TROCAR XCEL NON-BLD 11X100MML (ENDOMECHANICALS) ×3 IMPLANT
TROCAR XCEL NON-BLD 5MMX100MML (ENDOMECHANICALS) ×3 IMPLANT
TUBING INSUFFLATOR HI FLOW (MISCELLANEOUS) ×3 IMPLANT

## 2015-04-25 NOTE — Anesthesia Procedure Notes (Signed)
Procedure Name: Intubation Date/Time: 04/25/2015 10:50 AM Performed by: Jonna Clark Pre-anesthesia Checklist: Patient identified, Patient being monitored, Timeout performed, Emergency Drugs available and Suction available Patient Re-evaluated:Patient Re-evaluated prior to inductionOxygen Delivery Method: Circle system utilized Preoxygenation: Pre-oxygenation with 100% oxygen Intubation Type: IV induction Ventilation: Mask ventilation without difficulty Laryngoscope Size: Miller and 2 Grade View: Grade I Tube type: Oral Tube size: 7.0 mm Number of attempts: 1 Placement Confirmation: ETT inserted through vocal cords under direct vision,  positive ETCO2 and breath sounds checked- equal and bilateral Secured at: 21 cm Tube secured with: Tape Dental Injury: Teeth and Oropharynx as per pre-operative assessment

## 2015-04-25 NOTE — Op Note (Signed)
Laparoscopic Cholecystectomy  Pre-operative Diagnosis: Biliary colic, biliary dyskinesia  Post-operative Diagnosis: Same  Procedure: Laparoscopic cholecystectomy  Surgeon: Jerrol Banana. Burt Knack, MD FACS  Anesthesia: Gen. with endotracheal tube  Assistant: Surgical tech  Procedure Details  The patient was seen again in the Holding Room. The benefits, complications, treatment options, and expected outcomes were discussed with the patient. The risks of bleeding, infection, recurrence of symptoms, failure to resolve symptoms, bile duct damage, bile duct leak, retained common bile duct stone, bowel injury, any of which could require further surgery and/or ERCP, stent, or papillotomy were reviewed with the patient. The likelihood of improving the patient's symptoms with return to their baseline status is good. We discussed in the preop holding area again the options and the fact that Karen Chandler workup has been completed and Karen Chandler symptoms are classic for gallbladder disease without strong objective information to confirm gallbladder disease. Potential for not resolving all Karen Chandler symptoms was reviewed as were Dr. Dorothey Baseman suggestions. The patient and/or family concurred with the proposed plan, giving informed consent.  The patient was taken to Operating Room, identified as Karen Chandler and the procedure verified as Laparoscopic Cholecystectomy.  A Time Out was held and the above information confirmed.  Prior to the induction of general anesthesia, antibiotic prophylaxis was administered. VTE prophylaxis was in place. General endotracheal anesthesia was then administered and tolerated well. After the induction, the abdomen was prepped with Chloraprep and draped in the sterile fashion. The patient was positioned in the supine position.  Local anesthetic  was injected into the skin near the umbilicus and an incision made. The Veress needle was placed. Pneumoperitoneum was then created with CO2 and tolerated well  without any adverse changes in the patient's vital signs. A 11mm port was placed in the periumbilical position and the abdominal cavity was explored.  Two 5-mm ports were placed in the right upper quadrant and a 12 mm epigastric port was placed all under direct vision. All skin incisions  were infiltrated with a local anesthetic agent before making the incision and placing the trocars.   The patient was positioned  in reverse Trendelenburg, tilted slightly to the patient's left.  The gallbladder was identified, the fundus grasped and retracted cephalad. Adhesions were lysed bluntly. The infundibulum was grasped and retracted laterally, exposing the peritoneum overlying the triangle of Calot. This was then divided and exposed in a blunt fashion. A critical view of the cystic duct and cystic artery was obtained.  The cystic duct was clearly identified and bluntly dissected.   The cystic artery was doubly clipped and divided this allowed for good visualization of the cystic duct as it entered the infundibulum of the gallbladder. It was doubly clipped and divided and then a second branch of the cystic artery was doubly clipped and divided.  The gallbladder was taken from the gallbladder fossa in a retrograde fashion with the electrocautery. The gallbladder was removed and placed in an Endocatch bag. The liver bed was irrigated and inspected. Hemostasis was achieved with the electrocautery. Copious irrigation was utilized and was repeatedly aspirated until clear.  The gallbladder and Endocatch sac were then removed through the epigastric port site.   Inspection of the right upper quadrant was performed. No bleeding, bile duct injury or leak, or bowel injury was noted. Pneumoperitoneum was released.  The epigastric port site was closed with figure-of-eight 0 Vicryl sutures. 4-0 subcuticular Monocryl was used to close the skin. Steristrips and Mastisol and sterile dressings were  applied.  The  patient was then  extubated and brought to the recovery room in stable condition. Sponge, lap, and needle counts were correct at closure and at the conclusion of the case.   Findings: Chronic Cholecystitis   Estimated Blood Loss: 10 cc         Drains: None         Specimens: Gallbladder           Complications: none               Ivin Rosenbloom E. Burt Knack, MD, FACS

## 2015-04-25 NOTE — Discharge Instructions (Signed)
Remove dressing in 24 hours. °May shower in 24 hours. °Leave paper strips in place. °Resume all home medications. °Follow-up with Dr. Beaumont Austad in 10 days. °

## 2015-04-25 NOTE — Progress Notes (Signed)
Preoperative Review   Patient is met in the preoperative holding area. The history is reviewed in the chart and with the patient. I personally reviewed the options and rationale as well as the risks of this procedure that have been previously discussed with the patient. All questions asked by the patient and/or family were answered to their satisfaction.  Patient agrees to proceed with this procedure at this time.  Jahniah Pallas E Elfego Giammarino M.D. FACS  

## 2015-04-25 NOTE — Transfer of Care (Signed)
Immediate Anesthesia Transfer of Care Note  Patient: Karen Chandler  Procedure(s) Performed: Procedure(s) with comments: LAPAROSCOPIC CHOLECYSTECTOMY  (N/A) - laparoscopic cholecystectomy  Patient Location: PACU  Anesthesia Type:General  Level of Consciousness: awake, alert  and oriented  Airway & Oxygen Therapy: Patient Spontanous Breathing and Patient connected to face mask oxygen  Post-op Assessment: Report given to RN and Post -op Vital signs reviewed and stable  Post vital signs: Reviewed and stable  Last Vitals:  Filed Vitals:   04/25/15 1005 04/25/15 1149  BP: 134/84 150/75  Pulse: 95 105  Temp: 36.5 C 36.8 C  Resp: 16 21    Complications: No apparent anesthesia complications

## 2015-04-25 NOTE — Anesthesia Preprocedure Evaluation (Signed)
Anesthesia Evaluation  Patient identified by MRN, date of birth, ID band Patient awake    Reviewed: Allergy & Precautions, H&P , NPO status , Patient's Chart, lab work & pertinent test results, reviewed documented beta blocker date and time   Airway Mallampati: II  TM Distance: >3 FB Neck ROM: full    Dental  (+) Teeth Intact   Pulmonary neg pulmonary ROS,    Pulmonary exam normal        Cardiovascular negative cardio ROS Normal cardiovascular exam Rhythm:regular Rate:Normal     Neuro/Psych negative neurological ROS  negative psych ROS   GI/Hepatic negative GI ROS, Neg liver ROS,   Endo/Other  negative endocrine ROS  Renal/GU negative Renal ROS  negative genitourinary   Musculoskeletal   Abdominal   Peds  Hematology negative hematology ROS (+)   Anesthesia Other Findings Past Medical History:   Depression                                                   Seasonal allergies                                           Wears contact lenses                                       Past Surgical History:   APPENDECTOMY                                     02/22/2013     Comment:Dr. Burt Knack   WISDOM TOOTH EXTRACTION                                       ESOPHAGOGASTRODUODENOSCOPY (EGD) WITH PROPOFOL  N/A 04/18/2015       Comment:Procedure: ESOPHAGOGASTRODUODENOSCOPY (EGD)               WITH PROPOFOL;  Surgeon: Lucilla Lame, MD;                Location: [facility];  Service:               Endoscopy;  Laterality: N/A; BMI    Body Mass Index   35.20 kg/m 2     Reproductive/Obstetrics negative OB ROS                             Anesthesia Physical Anesthesia Plan  ASA: II  Anesthesia Plan: General ETT   Post-op Pain Management:    Induction:   Airway Management Planned:   Additional Equipment:   Intra-op Plan:   Post-operative Plan:   Informed Consent: I have reviewed the  patients History and Physical, chart, labs and discussed the procedure including the risks, benefits and alternatives for the proposed anesthesia with the patient or authorized representative who has indicated his/her understanding and acceptance.   Dental Advisory Given  Plan Discussed with: CRNA  Anesthesia Plan Comments:  Anesthesia Quick Evaluation  

## 2015-04-26 LAB — SURGICAL PATHOLOGY

## 2015-04-26 NOTE — Anesthesia Postprocedure Evaluation (Signed)
Anesthesia Post Note  Patient: Karen Chandler  Procedure(s) Performed: Procedure(s) (LRB): LAPAROSCOPIC CHOLECYSTECTOMY  (N/A)  Patient location during evaluation: PACU Anesthesia Type: General Level of consciousness: awake and alert Pain management: pain level controlled Vital Signs Assessment: post-procedure vital signs reviewed and stable Respiratory status: spontaneous breathing, nonlabored ventilation, respiratory function stable and patient connected to nasal cannula oxygen Cardiovascular status: blood pressure returned to baseline and stable Postop Assessment: no signs of nausea or vomiting Anesthetic complications: no    Last Vitals:  Filed Vitals:   04/25/15 1342 04/25/15 1419  BP: 134/73 128/77  Pulse: 107 109  Temp:    Resp: 16 16    Last Pain:  Filed Vitals:   04/26/15 0840  PainSc: [name]

## 2015-04-30 ENCOUNTER — Telehealth: Payer: Self-pay | Admitting: Surgery

## 2015-04-30 NOTE — Telephone Encounter (Signed)
Had surgery and concerned about a couple of incisions. Two are red and warm and one is leaving a yellow stain on her shirt. Getting hot and cold for no reason but didn't check to see if she has a fever. She is still taking the medication that was prescribed.

## 2015-04-30 NOTE — Telephone Encounter (Signed)
Returned phone call to patient at this time. Denies fever, nausea/vomiting, constipation, and diarrhea. "Yellow" drainage that she is referring to is thin and does not look like it is purulent per patient, upon speaking with her about the redness, it sounds like incisions are slightly pink. Patient has not been showering. Explained to patient that she can take off all original bandages but leave Steri-strips in place, this area may continue to produce the serous drainage and she will need to keep a guaze over this area. More importantly, she will need to shower daily, washing around incisions with soap and water to avoid infection from occuring, dry thoroughly, and replace dressing. She verbalizes understanding of all of this information. Encouraged to call back with any further questions or concerns.

## 2015-05-06 ENCOUNTER — Encounter (INDEPENDENT_AMBULATORY_CARE_PROVIDER_SITE_OTHER): Payer: Self-pay

## 2015-05-06 ENCOUNTER — Encounter: Payer: Self-pay | Admitting: General Surgery

## 2015-05-06 ENCOUNTER — Ambulatory Visit (INDEPENDENT_AMBULATORY_CARE_PROVIDER_SITE_OTHER): Payer: BC Managed Care – PPO | Admitting: General Surgery

## 2015-05-06 VITALS — BP 140/85 | HR 98 | Temp 96.4°F | Wt 221.0 lb

## 2015-05-06 DIAGNOSIS — Z4889 Encounter for other specified surgical aftercare: Secondary | ICD-10-CM

## 2015-05-06 MED ORDER — HYDROCODONE-ACETAMINOPHEN 5-325 MG PO TABS: 1.0000 | ORAL_TABLET | ORAL | Status: DC | PRN

## 2015-05-06 NOTE — Patient Instructions (Addendum)
Please give [place] a call in two weeks if you continue to have diarrhea. Otherwise, we will see you if you need [place] again! Please call our office with any questions or concerns.  Please do not submerge in a tub, hot tub, or pool until incisions are completely sealed.  Use sun block to incision area over the next year if this area will be exposed to sun. This helps decrease scarring.  You may now resume your normal activities. Listen to your body when lifting, if you have pain when lifting, stop and then try again in a few days.  If you develop redness, drainage, or pain at incision sites- call our office immediately and speak with a nurse.

## 2015-05-06 NOTE — Progress Notes (Signed)
Outpatient Surgical Follow Up  05/06/2015  Karen Chandler is an 20 y.o. female.   Chief Complaint  Patient presents with  . Routine Post Op    Laparoscopic Cholecystectomy Dr.     HPI: 20 year old female returns to clinic 10 days status post laparoscopic cholecystectomy. She's been having continued abdominal pain however this is improving. She is also been having constant nausea and diarrhea. She denies any vomiting. She denies any fevers, chills, chest pain, shortness of breath, malaise. She states that the abdominal pain is primarily at her upper midline incision site but also some in her most lateral site. The diarrhea is approximately 2 or 3 times per day and she has not noticed any changes over the last several days.  Past Medical History  Diagnosis Date  . Depression   . Seasonal allergies   . Wears contact lenses     Past Surgical History  Procedure Laterality Date  . Appendectomy  02/22/2013    Dr. Burt Knack  . Wisdom tooth extraction    . Esophagogastroduodenoscopy (egd) with propofol N/A 04/18/2015    Procedure: ESOPHAGOGASTRODUODENOSCOPY (EGD) WITH PROPOFOL;  Surgeon: Lucilla Lame, MD;  Location: [place];  Service: Endoscopy;  Laterality: N/A;  . Cholecystectomy N/A 04/25/2015    Procedure: LAPAROSCOPIC CHOLECYSTECTOMY ;  Surgeon: Florene Glen, MD;  Location: [facility];  Service: General;  Laterality: N/A;  laparoscopic cholecystectomy    Family History  Problem Relation Age of Onset  . Hypertension Mother   . Anxiety disorder Mother   . Depression Mother   . Multiple sclerosis Father   . Hypertension Maternal Grandmother   . Heart disease Maternal Grandmother   . Heart disease Maternal Grandfather   . Diabetes Paternal Grandfather     Social History:  reports that she has never smoked. She does not have any smokeless tobacco history on file. She reports that she does not drink alcohol or use illicit drugs.  Allergies: No Known  Allergies  Medications reviewed.    ROS A multipoint review of systems was completed, all pertinent positives and negatives were documented in the history of present illness the remainder negative.   BP 140/85 mmHg  Pulse 98  Temp(Src) 96.4 F (35.8 C) (Oral)  Wt 100.245 kg (221 lb)  Physical Exam  Gen.: No acute distress Chest: Clear to auscultation Heart: Regular rate and rhythm Abdomen: Soft, minimally tender to midline and most lateral incision site, nondistended. Her laparoscopic cholecystectomy incisions are well approximated without any evidence of erythema or drainage.   No results found for this or any previous visit (from the past 48 hour(s)). No results found.  Assessment/Plan:  1. Aftercare following surgery 20 year old female status post laparoscopic cholecystectomy. Pathology reviewed and given to the patient and her dad. Discussed her postoperative pain and we'll provide her with a short course of increased pain medications. Discussed that the pain should be improved enough to be off of the narcotics by the completion of this perception. Also discussed her nausea and diarrhea that this is a common side effect of having a cholecystectomy that is usually self-limiting. 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 She will call the clinic if she continues to have diarrhea to initiate treatment of such. Otherwise the signs and symptoms of infections any of her incisions were discussed and she will follow up on an as-needed basis. - HYDROcodone-acetaminophen (NORCO/VICODIN) 5-325 MG tablet; Take 1  tablet by mouth every 4 (four) hours as needed for moderate pain.  Dispense: 20 tablet; Refill: 0     Clayburn Pert, MD [facility] General Surgeon  05/06/2015,10:08 AM

## 2015-12-13 ENCOUNTER — Emergency Department
Admission: EM | Admit: 2015-12-13 | Discharge: 2015-12-13 | Disposition: A | Discharge status: Home / Self Care | Payer: BC Managed Care – PPO | Attending: Emergency Medicine | Admitting: Emergency Medicine

## 2015-12-13 ENCOUNTER — Encounter: Payer: Self-pay | Admitting: Emergency Medicine

## 2015-12-13 ENCOUNTER — Emergency Department: Payer: BC Managed Care – PPO

## 2015-12-13 DIAGNOSIS — R110 Nausea: Secondary | ICD-10-CM | POA: Insufficient documentation

## 2015-12-13 DIAGNOSIS — R1013 Epigastric pain: Secondary | ICD-10-CM | POA: Diagnosis present

## 2015-12-13 DIAGNOSIS — Z79899 Other long term (current) drug therapy: Secondary | ICD-10-CM | POA: Diagnosis not present

## 2015-12-13 LAB — URINALYSIS COMPLETE WITH MICROSCOPIC (ARMC ONLY)
BILIRUBIN URINE: NEGATIVE
GLUCOSE, UA: NEGATIVE mg/dL
Ketones, ur: NEGATIVE mg/dL
Nitrite: NEGATIVE
PH: 6 (ref 5.0–8.0)
Protein, ur: 30 mg/dL — AB
Specific Gravity, Urine: 1.029 (ref 1.005–1.030)

## 2015-12-13 LAB — COMPREHENSIVE METABOLIC PANEL
ALBUMIN: 4.1 g/dL (ref 3.5–5.0)
ALT: 11 U/L — AB (ref 14–54)
AST: 19 U/L (ref 15–41)
Alkaline Phosphatase: 55 U/L (ref 38–126)
Anion gap: 6 (ref 5–15)
BUN: 10 mg/dL (ref 6–20)
CHLORIDE: 104 mmol/L (ref 101–111)
CO2: 27 mmol/L (ref 22–32)
CREATININE: 0.94 mg/dL (ref 0.44–1.00)
Calcium: 9.5 mg/dL (ref 8.9–10.3)
GFR calc Af Amer: >60 mL/min (ref >60)
GFR calc non Af Amer: >60 mL/min (ref >60)
GLUCOSE: 122 mg/dL — AB (ref 65–99)
Potassium: 3.9 mmol/L (ref 3.5–5.1)
SODIUM: 137 mmol/L (ref 135–145)
Total Bilirubin: 0.2 mg/dL — ABNORMAL LOW (ref 0.3–1.2)
Total Protein: 8 g/dL (ref 6.5–8.1)

## 2015-12-13 LAB — CBC
HEMATOCRIT: 40.6 % (ref 35.0–47.0)
Hemoglobin: 13.7 g/dL (ref 12.0–16.0)
MCH: 28.1 pg (ref 26.0–34.0)
MCHC: 33.7 g/dL (ref 32.0–36.0)
MCV: 83.3 fL (ref 80.0–100.0)
PLATELETS: 306 10*3/uL (ref 150–440)
RBC: 4.87 MIL/uL (ref 3.80–5.20)
RDW: 13 % (ref 11.5–14.5)
WBC: 12.1 10*3/uL — ABNORMAL HIGH (ref 3.6–11.0)

## 2015-12-13 LAB — LIPASE, BLOOD: LIPASE: 25 U/L (ref 11–51)

## 2015-12-13 LAB — POCT PREGNANCY, URINE: PREG TEST UR: NEGATIVE

## 2015-12-13 MED ORDER — ONDANSETRON HCL 4 MG PO TABS: 4.0000 mg | ORAL_TABLET | Freq: Three times a day (TID) | ORAL | 0 refills | Status: DC | PRN

## 2015-12-13 MED ORDER — TRAMADOL HCL 50 MG PO TABS: 50.0000 mg | ORAL_TABLET | Freq: Four times a day (QID) | ORAL | 0 refills | Status: AC | PRN

## 2015-12-13 MED ORDER — GI COCKTAIL ~~LOC~~
ORAL | Status: AC
  Administered 2015-12-13: 30 mL via ORAL
  Filled 2015-12-13: qty 30

## 2015-12-13 MED ORDER — GI COCKTAIL ~~LOC~~
30.0000 mL | Freq: Once | ORAL | Status: AC
  Administered 2015-12-13: 30 mL via ORAL

## 2015-12-13 NOTE — ED Triage Notes (Signed)
Pt here for RLQ abdominal pain that started this morning.  Endorses some nausea. Has had appendectomy and has had her gallbladder removed.

## 2015-12-13 NOTE — ED Provider Notes (Addendum)
[facility] Emergency Department Provider Note  ____________________________________________   I have reviewed the triage vital signs and the nursing notes.   HISTORY  Chief Complaint Abdominal Pain    HPI Karen Chandler is a 20 y.o. female who is readily says post gallbladder surgery as well as appendectomy presents today complaining of epigastric abdominal pain and mild nausea since this morning. Has not vomited. Had a normal bowel movement. Denies fever or chills. Pain does not radiate. Denies any rash. No melena or bright red blood per rectum. No history of constipation. Pain is a dull ache. She does have some mild GERD symptoms. She is not normally on antiacids however. She does avoid spicy food at baseline though. She denies any chest pain or shortness of breath. Nothing makes it better nothing makes it worse. This morning when she woke up. She has no lower abdominal discomfort, no vaginal discharge. LMP was almost 2 weeks ago and normal.    Past Medical History:  Diagnosis Date  . Depression   . Seasonal allergies   . Wears contact lenses     Patient Active Problem List   Diagnosis Date Noted  . Biliary colic   . Abdominal pain, epigastric   . Gastritis     Past Surgical History:  Procedure Laterality Date  . APPENDECTOMY  02/22/2013   Dr. Burt Knack  . CHOLECYSTECTOMY N/A 04/25/2015   Procedure: LAPAROSCOPIC CHOLECYSTECTOMY ;  Surgeon: Florene Glen, MD;  Location: [facility];  Service: General;  Laterality: N/A;  laparoscopic cholecystectomy  . ESOPHAGOGASTRODUODENOSCOPY (EGD) WITH PROPOFOL N/A 04/18/2015   Procedure: ESOPHAGOGASTRODUODENOSCOPY (EGD) WITH PROPOFOL;  Surgeon: Lucilla Lame, MD;  Location: [place];  Service: Endoscopy;  Laterality: N/A;  . WISDOM TOOTH EXTRACTION      Prior to Admission medications   Medication Sig Start Date End Date Taking? Authorizing Provider  citalopram (CELEXA) 40 MG tablet Take 1 tablet  by mouth at bedtime.  03/25/15   Historical Provider, MD  fluticasone (FLONASE) 50 MCG/ACT nasal spray Place 2 sprays into the nose. 04/20/14   Historical Provider, MD  HYDROcodone-acetaminophen (NORCO/VICODIN) 5-325 MG tablet Take 1 tablet by mouth every 4 (four) hours as needed for moderate pain. 05/06/15   Clayburn Pert, MD  loratadine (CLARITIN) 10 MG tablet Take 10 mg by mouth daily.    Historical Provider, MD  Norgestimate-Ethinyl Estradiol Triphasic 0.18/0.215/0.25 MG-35 MCG tablet Take 1 tablet by mouth.    Historical Provider, MD    Allergies Review of patient's allergies indicates no known allergies.  Family History  Problem Relation Age of Onset  . Hypertension Mother   . Anxiety disorder Mother   . Depression Mother   . Multiple sclerosis Father   . Hypertension Maternal Grandmother   . Heart disease Maternal Grandmother   . Heart disease Maternal Grandfather   . Diabetes Paternal Grandfather     Social History Social History  Substance Use Topics  . Smoking status: Never Smoker  . Smokeless tobacco: Never Used  . Alcohol use No    Review of Systems Constitutional: No fever/chills Eyes: No visual changes. ENT: No sore throat. No stiff neck no neck pain Cardiovascular: Denies chest pain. Respiratory: Denies shortness of breath. Gastrointestinal:   no vomiting.  No diarrhea.  No constipation. Genitourinary: Negative for dysuria. Musculoskeletal: Negative lower extremity swelling Skin: Negative for rash. Neurological: Negative for severe headaches, focal weakness or numbness. 10-point ROS otherwise negative.  ____________________________________________   PHYSICAL EXAM:  VITAL SIGNS:  ED Triage Vitals [12/13/15 1757]  Enc Vitals Group     BP 123/77     Pulse Rate (!) 111     Resp 16     Temp 97.8 F (36.6 C)     Temp Source Oral     SpO2 99 %     Weight 215 lb (97.5 kg)     Height 5\' 6"  (1.676 m)     Head Circumference      Peak Flow      Pain  Score 8     Pain Loc      Pain Edu?      Excl. in [place]?     Constitutional: Alert and oriented. Well appearing and in no acute distress. Eyes: Conjunctivae are normal. PERRL. EOMI. Head: Atraumatic. Nose: No congestion/rhinnorhea. Mouth/Throat: Mucous membranes are moist.  Oropharynx non-erythematous. Neck: No stridor.   Nontender with no meningismus Cardiovascular: Normal rate, regular rhythm. Grossly normal heart sounds.  Good peripheral circulation. Respiratory: Normal respiratory effort.  No retractions. Lungs CTAB. Abdominal: Soft and Minimal tenderness to palpation in the epigastric region which reproduces her discomfort. No distention. No guarding no rebound Back:  There is no focal tenderness or step off.  there is no midline tenderness there are no lesions noted. there is no CVA tenderness Musculoskeletal: No lower extremity tenderness, no upper extremity tenderness. No joint effusions, no DVT signs strong distal pulses no edema Neurologic:  Normal speech and language. No gross focal neurologic deficits are appreciated.  Skin:  Skin is warm, dry and intact. No rash noted. Psychiatric: Mood and affect are normal. Speech and behavior are normal.  ____________________________________________   LABS (all labs ordered are listed, but only abnormal results are displayed)  Labs Reviewed  COMPREHENSIVE METABOLIC PANEL - Abnormal; Notable for the following:       Result Value   Glucose, Bld 122 (*)    ALT 11 (*)    Total Bilirubin 0.2 (*)    All other components within normal limits  CBC - Abnormal; Notable for the following:    WBC 12.1 (*)    All other components within normal limits  URINALYSIS COMPLETEWITH MICROSCOPIC ([facility] ONLY) - Abnormal; Notable for the following:    Color, Urine YELLOW (*)    APPearance CLEAR (*)    Hgb urine dipstick 1+ (*)    Protein, ur 30 (*)    Leukocytes, UA TRACE (*)    Bacteria, UA RARE (*)    Squamous Epithelial / LPF 0-5 (*)    All other  components within normal limits  URINE CULTURE  LIPASE, BLOOD  POC URINE PREG, ED  POCT PREGNANCY, URINE   ____________________________________________  EKG  I personally interpreted any EKGs ordered by me or triage  ____________________________________________  RADIOLOGY  I reviewed any imaging ordered by me or triage that were performed during my shift and, if possible, patient and/or family made aware of any abnormal findings. ____________________________________________   PROCEDURES  Procedure(s) performed: None  Procedures  Critical Care performed: None  ____________________________________________   INITIAL IMPRESSION / ASSESSMENT AND PLAN / ED COURSE  Pertinent labs & imaging results that were available during my care of the patient were reviewed by me and considered in my medical decision making (see chart for details).  Patient with epigastric abdominal pain for less than a day with no remaining gallbladder or appendix. A reassuring exam nonsurgical abdomen. No peritoneal signs. Soft. Minimally tender to deep palpation. Somewhat distractible. Patient has reassuring vital  signs reassuring lab work we will obtain a KUB as a precaution I do not think CT is warranted. There is no evidence this time of pancreatitis, her blood work is quite reassuring. She has no lower abdominal pain to suggest PID or torsion. White count is slightly elevated and nonspecific manner. Very large incidence of viral gastroenteritis presently in the community.  ----------------------------------------- 9:41 PM on 12/13/2015 -----------------------------------------  Patient remains quite well-appearing tolerate by mouth with no difficulty abdomen remains completely benign. We have further discussed her care. Patient apparently did start taking a "some sort of powder to stop her from having diarrhea. 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 The patient states that now there are normal consistency but she does for somewhat constipated. I've advised her to stop taking whenever this is and to continue good hydration. I do not see any imaging requirement in this patient given her exam lab findings and vitals etc. Her heart rate is in the 80s at this time. I will discharge her home with close outpatient follow-up. Patient is very much in agreement with this. We did discuss the risks benefits and alternatives of ultrasound as well as CT scan but she would prefer to go home and see if she feels better and return if she feels worse. I think this is reasonable. She understands that she must do so however she feels worse.  Clinical Course   ____________________________________________   FINAL CLINICAL IMPRESSION(S) / ED DIAGNOSES  Final diagnoses:  None      This chart was dictated using voice recognition software.  Despite best efforts to proofread,  errors can occur which can change meaning.      Schuyler Amor, MD 12/13/15 2015    Schuyler Amor, MD 12/13/15 2143

## 2015-12-15 LAB — URINE CULTURE

## 2017-02-19 ENCOUNTER — Emergency Department (HOSPITAL_COMMUNITY): Payer: BC Managed Care – PPO

## 2017-02-19 ENCOUNTER — Emergency Department (HOSPITAL_COMMUNITY)
Admission: EM | Admit: 2017-02-19 | Discharge: 2017-02-20 | Disposition: A | Discharge status: Home / Self Care | Payer: BC Managed Care – PPO | Attending: Emergency Medicine | Admitting: Emergency Medicine

## 2017-02-19 ENCOUNTER — Encounter (HOSPITAL_COMMUNITY): Payer: Self-pay | Admitting: Radiology

## 2017-02-19 ENCOUNTER — Other Ambulatory Visit: Payer: Self-pay

## 2017-02-19 DIAGNOSIS — R0602 Shortness of breath: Secondary | ICD-10-CM | POA: Diagnosis present

## 2017-02-19 LAB — BASIC METABOLIC PANEL WITH GFR
Anion gap: 11 (ref 5–15)
BUN: 11 mg/dL (ref 6–20)
CO2: 21 mmol/L — ABNORMAL LOW (ref 22–32)
Calcium: 9.1 mg/dL (ref 8.9–10.3)
Chloride: 108 mmol/L (ref 101–111)
Creatinine, Ser: 0.81 mg/dL (ref 0.44–1.00)
GFR calc Af Amer: >60 mL/min
GFR calc non Af Amer: >60 mL/min
Glucose, Bld: 115 mg/dL — ABNORMAL HIGH (ref 65–99)
Potassium: 3.3 mmol/L — ABNORMAL LOW (ref 3.5–5.1)
Sodium: 140 mmol/L (ref 135–145)

## 2017-02-19 LAB — CBC WITH DIFFERENTIAL/PLATELET
Basophils Absolute: 0 10*3/uL (ref 0.0–0.1)
Basophils Relative: 0 %
EOS ABS: 0 10*3/uL (ref 0.0–0.7)
EOS PCT: 0 %
HCT: 39.2 % (ref 36.0–46.0)
Hemoglobin: 13.1 g/dL (ref 12.0–15.0)
LYMPHS ABS: 1.6 10*3/uL (ref 0.7–4.0)
Lymphocytes Relative: 11 %
MCH: 28.6 pg (ref 26.0–34.0)
MCHC: 33.4 g/dL (ref 30.0–36.0)
MCV: 85.6 fL (ref 78.0–100.0)
MONO ABS: 0.3 10*3/uL (ref 0.1–1.0)
MONOS PCT: 2 %
Neutro Abs: 12.5 10*3/uL — ABNORMAL HIGH (ref 1.7–7.7)
Neutrophils Relative %: 87 %
PLATELETS: 296 10*3/uL (ref 150–400)
RBC: 4.58 MIL/uL (ref 3.87–5.11)
RDW: 13 % (ref 11.5–15.5)
WBC: 14.4 10*3/uL — ABNORMAL HIGH (ref 4.0–10.5)

## 2017-02-19 LAB — RAPID STREP SCREEN (MED CTR MEBANE ONLY): Streptococcus, Group A Screen (Direct): NEGATIVE

## 2017-02-19 LAB — TROPONIN I

## 2017-02-19 LAB — INFLUENZA PANEL BY PCR (TYPE A & B)
Influenza A By PCR: NEGATIVE
Influenza B By PCR: NEGATIVE

## 2017-02-19 MED ORDER — DEXTROSE 5 % IV SOLN
1.0000 g | Freq: Once | INTRAVENOUS | Status: AC
  Administered 2017-02-19: 1 g via INTRAVENOUS
  Filled 2017-02-19: qty 10

## 2017-02-19 MED ORDER — IOPAMIDOL (ISOVUE-370) INJECTION 76%
INTRAVENOUS | Status: AC
  Administered 2017-02-19: 75 mL
  Filled 2017-02-19: qty 100

## 2017-02-19 MED ORDER — ACETAMINOPHEN 325 MG PO TABS
650.0000 mg | ORAL_TABLET | Freq: Once | ORAL | Status: AC
  Administered 2017-02-19: 650 mg via ORAL
  Filled 2017-02-19: qty 2

## 2017-02-19 MED ORDER — SODIUM CHLORIDE 0.9 % IV BOLUS (SEPSIS)
1000.0000 mL | Freq: Once | INTRAVENOUS | Status: AC
  Administered 2017-02-19: 1000 mL via INTRAVENOUS

## 2017-02-19 MED ORDER — DEXAMETHASONE 4 MG PO TABS
10.0000 mg | ORAL_TABLET | Freq: Once | ORAL | Status: AC
  Administered 2017-02-19: 10 mg via ORAL
  Filled 2017-02-19: qty 2

## 2017-02-19 MED ORDER — IPRATROPIUM-ALBUTEROL 0.5-2.5 (3) MG/3ML IN SOLN
3.0000 mL | RESPIRATORY_TRACT | Status: DC
  Administered 2017-02-19 (×2): 3 mL via RESPIRATORY_TRACT
  Filled 2017-02-19 (×2): qty 3

## 2017-02-19 MED ORDER — RACEPINEPHRINE HCL 2.25 % IN NEBU
0.5000 mL | INHALATION_SOLUTION | Freq: Once | RESPIRATORY_TRACT | Status: AC
  Administered 2017-02-19: 0.5 mL via RESPIRATORY_TRACT
  Filled 2017-02-19: qty 0.5

## 2017-02-19 MED ORDER — PREDNISONE 20 MG PO TABS
ORAL_TABLET | ORAL | 0 refills | Status: DC
Start: 2017-02-19 — End: 2017-04-12

## 2017-02-19 MED ORDER — CEPHALEXIN 500 MG PO CAPS: 500.0000 mg | ORAL_CAPSULE | Freq: Four times a day (QID) | ORAL | 0 refills | Status: DC

## 2017-02-19 MED ORDER — ALBUTEROL SULFATE HFA 108 (90 BASE) MCG/ACT IN AERS
2.0000 | INHALATION_SPRAY | Freq: Once | RESPIRATORY_TRACT | Status: AC
  Administered 2017-02-19: 2 via RESPIRATORY_TRACT
  Filled 2017-02-19: qty 6.7

## 2017-02-19 MED ORDER — IOPAMIDOL (ISOVUE-370) INJECTION 76%
INTRAVENOUS | Status: AC
  Administered 2017-02-19: 100 mL
  Filled 2017-02-19: qty 100

## 2017-02-19 MED ORDER — METHYLPREDNISOLONE SODIUM SUCC 125 MG IJ SOLR: 125.0000 mg | Freq: Once | INTRAMUSCULAR | Status: DC

## 2017-02-19 NOTE — ED Notes (Signed)
Bed: UU72 Expected date:  Expected time:  Means of arrival:  Comments: 21 yo f resp distress

## 2017-02-19 NOTE — ED Triage Notes (Signed)
Transported by Charisse Klinefelter from [place] for [facility] that started yesterday. Per EMS, pt presents with diminished breath sounds and wheezing. Pt also reports a sore throat and bilateral ear pain. PTA [facility] administered 125 mg of Solu-Medrol IV, 2 mg of Magnesium IV, and (2) albuterol/atrovent neb. Treatments. Pt reports minimal relief.

## 2017-02-19 NOTE — ED Notes (Signed)
ED Provider at bedside. 

## 2017-02-19 NOTE — ED Provider Notes (Signed)
Emergency Department Provider Note   I have reviewed the triage vital signs and the nursing notes.   HISTORY  Chief Complaint Shortness of Breath   HPI Karen Chandler is a 21 y.o. female without significant past medical history presents to the emergency department today with dyspnea.  Patient states she had a sore throat for Sunday and Monday but that seems to have gone away and then over the last day and a half is a progressively worsening difficulty taking deep breaths.  She did see her primary doctor who called called EMS who brought her here for further evaluation.  There is no fever, back pain, abdominal pain or history of asthma.  Patient reported to nurse that she does have bilateral ear pain.  Sounds like she had a breathing treatment, steroids and magnesium in route.  States some help with this but nothing significant.  No history of anything similar to this.  No other associated or modifying symptoms.   Past Medical History:  Diagnosis Date  . Depression   . Seasonal allergies   . Wears contact lenses     Patient Active Problem List   Diagnosis Date Noted  . Biliary colic   . Abdominal pain, epigastric   . Gastritis     Past Surgical History:  Procedure Laterality Date  . APPENDECTOMY  02/22/2013   Dr. Burt Knack  . CHOLECYSTECTOMY N/A 04/25/2015   Procedure: LAPAROSCOPIC CHOLECYSTECTOMY ;  Surgeon: Florene Glen, MD;  Location: [facility];  Service: General;  Laterality: N/A;  laparoscopic cholecystectomy  . ESOPHAGOGASTRODUODENOSCOPY (EGD) WITH PROPOFOL N/A 04/18/2015   Procedure: ESOPHAGOGASTRODUODENOSCOPY (EGD) WITH PROPOFOL;  Surgeon: Lucilla Lame, MD;  Location: [place];  Service: Endoscopy;  Laterality: N/A;  . WISDOM TOOTH EXTRACTION      Current Outpatient Rx  . Order #: [id number] Class: Historical Med  . Order #: [id number] Class: Historical Med  . Order #: [id number] Class: Historical Med  . Order #: [id number] Class: Historical Med  . Order #:  [id number] Class: Print  . Order #: [id number] Class: Print    Allergies Patient has no known allergies.  Family History  Problem Relation Age of Onset  . Hypertension Mother   . Anxiety disorder Mother   . Depression Mother   . Multiple sclerosis Father   . Hypertension Maternal Grandmother   . Heart disease Maternal Grandmother   . Heart disease Maternal Grandfather   . Diabetes Paternal Grandfather     Social History Social History   Tobacco Use  . Smoking status: Never Smoker  . Smokeless tobacco: Never Used  Substance Use Topics  . Alcohol use: No  . Drug use: No    Review of Systems  All other systems negative except as documented in the HPI. All pertinent positives and negatives as reviewed in the HPI. ____________________________________________   PHYSICAL EXAM:  VITAL SIGNS: ED Triage Vitals  Blood pressure 139/80, pulse (!) 110, temperature 98 F (36.7 C), temperature source Oral, resp. rate 20, SpO2 99 %.  Constitutional: Alert and oriented. Well appearing and in no acute distress. Eyes: Conjunctivae are normal. PERRL. EOMI. Head: Atraumatic. Nose: No congestion/rhinnorhea. Mouth/Throat: Mucous membranes are moist.  Oropharynx non-erythematous. Neck: No stridor.  No meningeal signs.   Cardiovascular: tachycardic rate, regular rhythm. Good peripheral circulation. Grossly normal heart sounds.   Respiratory: tachypneic respiratory effort.  No retractions. Lungs significantly diminished with wheezing. Also has inspiratory stridor intermittently. Gastrointestinal: Soft and nontender. No distention.  Musculoskeletal: No lower extremity tenderness nor edema. No gross  deformities of extremities. Neurologic:  Normal speech and language. No gross focal neurologic deficits are appreciated.  Skin:  Skin is warm, dry and intact. No rash noted.   ____________________________________________   LABS (all labs ordered are listed, but only abnormal results are  displayed)  Labs Reviewed  CBC WITH DIFFERENTIAL/PLATELET - Abnormal; Notable for the following components:      Result Value   WBC 14.4 (*)    Neutro Abs 12.5 (*)    All other components within normal limits  BASIC METABOLIC PANEL - Abnormal; Notable for the following components:   Potassium 3.3 (*)    CO2 21 (*)    Glucose, Bld 115 (*)    All other components within normal limits  RAPID STREP SCREEN (NOT AT [facility])  CULTURE, GROUP A STREP [facility])  INFLUENZA PANEL BY PCR (TYPE A & B)  TROPONIN I   ____________________________________________  EKG   EKG Interpretation  Date/Time:  Friday February 19 2017 17:24:07 EST Ventricular Rate:  102 PR Interval:    QRS Duration: 86 QT Interval:  441 QTC Calculation: 575 R Axis:   59 Text Interpretation:  Sinus tachycardia Prolonged QT interval Baseline wander in lead(s) V3 No old tracing to compare Confirmed by Merrily Pew [phone]) on 02/19/2017 6:09:50 PM       ____________________________________________  RADIOLOGY  Dg Neck Soft Tissue  Result Date: 02/19/2017 CLINICAL DATA:  Sore throat.  Shortness of breath and wheezing. EXAM: NECK SOFT TISSUES - 1+ VIEW COMPARISON:  None. FINDINGS: There is no evidence of retropharyngeal soft tissue swelling or epiglottic enlargement. The cervical airway is unremarkable and no radio-opaque foreign body identified. IMPRESSION: Negative. Electronically Signed   By: Fidela Salisbury M.D.   On: 02/19/2017 18:54   Dg Chest 2 View  Result Date: 02/19/2017 CLINICAL DATA:  Sore throat, shortness of breath, wheezing. EXAM: CHEST  2 VIEW COMPARISON:  02/19/2017 FINDINGS: Heart and mediastinal contours are within normal limits. No focal opacities or effusions. No acute bony abnormality. IMPRESSION: No active cardiopulmonary disease. Electronically Signed   By: Rolm Baptise M.D.   On: 02/19/2017 18:55   Ct Soft Tissue Neck W Contrast  Result Date: 02/19/2017 CLINICAL DATA:  Sore throat.   Bilateral ear pain. EXAM: CT NECK WITH CONTRAST TECHNIQUE: Multidetector CT imaging of the neck was performed using the standard protocol following the bolus administration of intravenous contrast. CONTRAST:  159mL ISOVUE-370 IOPAMIDOL (ISOVUE-370) INJECTION 76%, 40mL ISOVUE-370 IOPAMIDOL (ISOVUE-370) INJECTION 76% COMPARISON:  Neck radiographs 02/19/2017 FINDINGS: Pharynx and larynx: Symmetric pharyngeal soft tissues with at most mild prominence of the palatine tonsils. Mild motion artifact through the lower pharynx and larynx. Closed glottis. Patent airway. No fluid collection or significant inflammatory changes in the parapharyngeal or retropharyngeal spaces. Salivary glands: No inflammation, mass, or stone. Thyroid: 17 mm left thyroid nodule. Lymph nodes: Level IIa lymph nodes measure 10 mm in short axis bilaterally, upper limits of normal and likely benign/ reactive. No frankly enlarged or suspicious lymph nodes in the neck. Vascular: Major vascular structures of the neck are patent. Limited intracranial: Unremarkable. Visualized orbits: Unremarkable. Mastoids and visualized paranasal sinuses: Clear. Skeleton: No acute osseous abnormality or suspicious osseous lesion. Upper chest: Reported separately. Other: None. IMPRESSION: 1. No acute abnormality identified in the neck. 2. Left thyroid nodule. Recommend further evaluation with nonemergent ultrasound. Electronically Signed   By: Logan Bores M.D.   On: 02/19/2017 20:15   Ct Angio Chest Pe W And/or Wo Contrast  Result Date: 02/19/2017 CLINICAL  DATA:  Shortness of breath since yesterday. EXAM: CT ANGIOGRAPHY CHEST WITH CONTRAST TECHNIQUE: Multidetector CT imaging of the chest was performed using the standard protocol during bolus administration of intravenous contrast. Multiplanar CT image reconstructions and MIPs were obtained to evaluate the vascular anatomy. CONTRAST:  131mL ISOVUE-370 IOPAMIDOL (ISOVUE-370) INJECTION 76%, 29mL ISOVUE-370 IOPAMIDOL  (ISOVUE-370) INJECTION 76% COMPARISON:  None. FINDINGS: Cardiovascular: The heart is normal in size. No pericardial effusion. The aorta is normal in caliber. No dissection. Branch vessels appear normal. The pulmonary arterial tree is fairly well opacified. No definite filling defects to suggest pulmonary embolism. Mediastinum/Nodes: No mediastinal or hilar mass or adenopathy. Normal residual thymic tissue noted in the anterior mediastinum. Lungs/Pleura: The lungs are clear. No pulmonary lesions or pleural effusion. Upper Abdomen: No significant findings. Status post cholecystectomy. Musculoskeletal: No breast masses, supraclavicular or axillary lymphadenopathy. 12 mm lesion in the left lobe of the thyroid gland near the isthmus. Recommend ultrasound correlation and follow-up. The the bony thorax is intact. Review of the MIP images confirms the above findings. IMPRESSION: 1. No CT findings for pulmonary embolism. 2. Normal thoracic aorta. 3. No acute pulmonary findings. 4. 12 mm left thyroid lobe lesion. Recommend ultrasound correlation and follow-up. Electronically Signed   By: Marijo Sanes M.D.   On: 02/19/2017 20:00    ____________________________________________   PROCEDURES  Procedure(s) performed:   Procedures   ____________________________________________   INITIAL IMPRESSION / ASSESSMENT AND PLAN / ED COURSE  Patient with stridor and decreased breath sounds. Concerned for upper airway involvement. Already had steroids, will give racemic epi as well. Check labs in case she needs a CT. also get imaging of neck and chest.  Repeat breathing treatment.  Also give some fluids.  Will need frequent re-evaluations with the stridor.  On reevaluation, patient without stridor, breathign normally. Consistent normal pulse ox. Laughing and joking with her mother. Has not had breathing treatments yet.  reeval with stridor. Still tachycardic. Skin warmer than previously. Suspect fever. Workup negative  thus far.  Possibly PE? Vs epiglotittis that is not immediately obvious? Will CT, antiopyretics and another liter of fluids.  Workup was overall unremarkable.  Suspect maybe the heart rate is from the albuterol.  Patient has no stridor on multiple re-evaluations.  Her lungs have improved significantly she is moving more air and is a wheezing.  Suspect that this could be bronchoconstriction causing her symptoms.  Heart rate at time of discharge was at 100.  I discussed having an observation stay in the hospital versus discharge.  Patient did state that she preferred being discharged and she can make it back if there is any worsening symptoms otherwise will follow up with her doctor in 2 days.  Low suspicion for myocarditis, pericarditis, occult bacteremia or other causes for her symptoms at this time.  Pertinent labs & imaging results that were available during my care of the patient were reviewed by me and considered in my medical decision making (see chart for details).  ____________________________________________  FINAL CLINICAL IMPRESSION(S) / ED DIAGNOSES  Final diagnoses:  Shortness of breath     MEDICATIONS GIVEN DURING THIS VISIT:  Medications  ipratropium-albuterol (DUONEB) 0.5-2.5 (3) MG/3ML nebulizer solution 3 mL (3 mLs Nebulization Given 02/19/17 2334)  Racepinephrine HCl 2.25 % nebulizer solution 0.5 mL (0.5 mLs Nebulization Given 02/19/17 1759)  sodium chloride 0.9 % bolus 1,000 mL (0 mLs Intravenous Stopped 02/19/17 1900)  acetaminophen (TYLENOL) tablet 650 mg (650 mg Oral Given 02/19/17 2024)  sodium chloride 0.9 %  bolus 1,000 mL (0 mLs Intravenous Stopped 02/19/17 2130)  iopamidol (ISOVUE-370) 76 % injection (100 mLs  Contrast Given 02/19/17 1926)  iopamidol (ISOVUE-370) 76 % injection (75 mLs  Contrast Given 02/19/17 1942)  cefTRIAXone (ROCEPHIN) 1 g in dextrose 5 % 50 mL IVPB (0 g Intravenous Stopped 02/19/17 2153)  sodium chloride 0.9 % bolus 1,000 mL (0 mLs  Intravenous Stopped 02/19/17 2240)  dexamethasone (DECADRON) tablet 10 mg (10 mg Oral Given 02/19/17 2333)  albuterol (PROVENTIL HFA;VENTOLIN HFA) 108 (90 Base) MCG/ACT inhaler 2 puff (2 puffs Inhalation Given 02/19/17 2334)     NEW OUTPATIENT MEDICATIONS STARTED DURING THIS VISIT:  This SmartLink is deprecated. Use AVSMEDLIST instead to display the medication list for a patient.  Note:  This note was prepared with assistance of Dragon voice recognition software. Occasional wrong-word or sound-a-like substitutions may have occurred due to the inherent limitations of voice recognition software.   Merrily Pew, MD 02/19/17 [phone]

## 2017-02-22 DIAGNOSIS — F444 Conversion disorder with motor symptom or deficit: Secondary | ICD-10-CM

## 2017-02-22 DIAGNOSIS — R498 Other voice and resonance disorders: Secondary | ICD-10-CM

## 2017-02-22 DIAGNOSIS — R061 Stridor: Secondary | ICD-10-CM

## 2017-02-22 HISTORY — DX: Conversion disorder with motor symptom or deficit: F44.4

## 2017-02-22 HISTORY — DX: Stridor: R06.1

## 2017-02-22 HISTORY — DX: Other voice and resonance disorders: R49.8

## 2017-02-22 LAB — CULTURE, GROUP A STREP (THRC)

## 2017-02-24 ENCOUNTER — Other Ambulatory Visit: Payer: Self-pay | Admitting: Family Medicine

## 2017-02-24 DIAGNOSIS — E041 Nontoxic single thyroid nodule: Secondary | ICD-10-CM

## 2017-02-26 ENCOUNTER — Ambulatory Visit
Admission: RE | Admit: 2017-02-26 | Discharge: 2017-02-26 | Disposition: A | Discharge status: Home / Self Care | Payer: BC Managed Care – PPO | Source: Ambulatory Visit | Attending: Family Medicine | Admitting: Family Medicine

## 2017-02-26 DIAGNOSIS — E041 Nontoxic single thyroid nodule: Secondary | ICD-10-CM

## 2017-03-04 ENCOUNTER — Other Ambulatory Visit: Payer: Self-pay | Admitting: Physician Assistant

## 2017-03-04 DIAGNOSIS — E041 Nontoxic single thyroid nodule: Secondary | ICD-10-CM

## 2017-03-17 ENCOUNTER — Ambulatory Visit
Admission: RE | Admit: 2017-03-17 | Discharge: 2017-03-17 | Disposition: A | Discharge status: Home / Self Care | Payer: BC Managed Care – PPO | Source: Ambulatory Visit | Attending: Physician Assistant | Admitting: Physician Assistant

## 2017-03-17 DIAGNOSIS — E041 Nontoxic single thyroid nodule: Secondary | ICD-10-CM

## 2017-03-17 DIAGNOSIS — E048 Other specified nontoxic goiter: Secondary | ICD-10-CM | POA: Insufficient documentation

## 2017-03-17 NOTE — Progress Notes (Signed)
Patient clinically stable  Post procedure. Mother with patient.bandade dressing intact/dry. Dr Kathlene Cote out to speak with patient and mother with questions answered. Discharge teaching done with questions answered.

## 2017-03-17 NOTE — Discharge Instructions (Signed)
Thyroid Biopsy, Care After °Refer to this sheet in the next few weeks. These instructions provide you with information on caring for yourself after your procedure. Your health care provider may also give you more specific instructions. Your treatment has been planned according to current medical practices, but problems sometimes occur. Call your health care provider if you have any problems or questions after your procedure. °What can I expect after the procedure? °After your procedure, it is typical to have the following: °· You may have soreness and tenderness at the biopsy site for a few days. °· You may have a sore throat or a hoarse voice if you had an open biopsy. This should go away after a couple days. ° °Follow these instructions at home: °· Take medicines only as directed by your health care provider. °· To ease discomfort at the biopsy site: °? Keep your head raised on a pillow when you are lying down. °? Support the back of your head and neck with both hands as you sit up from a lying position. °· If you have a sore throat, try using throat lozenges or gargling with warm salt water. °· Keep all follow-up visits as directed by your health care provider. This is important. °Contact a health care provider if: °· You have a fever. °Get help right away if: °· You have severe bleeding from the biopsy site. °· You have difficulty swallowing. °· You have drainage, redness, swelling, or pain at the biopsy site. °· You have swollen glands (lymph nodes) in your neck. °This information is not intended to replace advice given to you by your health care provider. Make sure you discuss any questions you have with your health care provider. °Document Released: 09/20/2013 Document Revised: 10/27/2015 Document Reviewed: 05/18/2013 °Elsevier Interactive Patient Education © 2018 [employer] Inc[employer] ° °

## 2017-03-18 LAB — CYTOLOGY - NON PAP

## 2017-04-12 ENCOUNTER — Encounter: Payer: Self-pay | Admitting: Surgery

## 2017-04-12 ENCOUNTER — Ambulatory Visit: Payer: BC Managed Care – PPO | Admitting: Surgery

## 2017-04-12 VITALS — BP 146/101 | HR 88 | Temp 97.6°F | Wt 225.0 lb

## 2017-04-12 DIAGNOSIS — E042 Nontoxic multinodular goiter: Secondary | ICD-10-CM | POA: Diagnosis not present

## 2017-04-12 NOTE — Progress Notes (Signed)
Karen Chandler is an 22 y.o. female.   Chief Complaint: thyroid nodule HPI: Patient had a thyroid nodule that was found incidentally.  She was being worked up for a sore throat and for stridor (she saw an ENT physician and no stridor was identified nor was there any nerve dysfunction).  She lives in [place] now and prefers to get her care at that end of town.  She describes being tired all the time.  She has not seen an endocrinologist.  She had a biopsy of the left-sided nodule which was completely benign and showed colloid.  The the second smaller nodule was not biopsied but was considered low risk.  Past Medical History:  Diagnosis Date  . Abdominal pain, epigastric   . Biliary colic   . Depression   . 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 Gastritis   . Inspiratory stridor 02/22/2017  . Seasonal allergies   . Wears contact lenses     Past Surgical History:  Procedure Laterality Date  . APPENDECTOMY  02/22/2013   Dr. Burt Knack  . CHOLECYSTECTOMY N/A 04/25/2015   Procedure: LAPAROSCOPIC CHOLECYSTECTOMY ;  Surgeon: Florene Glen, MD;  Location: [facility];  Service: General;  Laterality: N/A;  laparoscopic cholecystectomy  . ESOPHAGOGASTRODUODENOSCOPY (EGD) WITH PROPOFOL N/A 04/18/2015   Procedure: ESOPHAGOGASTRODUODENOSCOPY (EGD) WITH PROPOFOL;  Surgeon: Lucilla Lame, MD;  Location: [place];  Service: Endoscopy;  Laterality: N/A;  . WISDOM TOOTH EXTRACTION      Family History  Problem Relation Age of Onset  . Hypertension Mother   . Anxiety disorder Mother   . Depression Mother   . Multiple sclerosis Father   . Hypertension Maternal Grandmother   . Heart disease Maternal Grandmother   . Heart disease Maternal Grandfather   . Diabetes Paternal Grandfather    Social History:  reports that  has never smoked. she has never used smokeless tobacco. She reports that she does not drink alcohol or use drugs.  Allergies: No Known Allergies   (Not in a hospital  admission)   Review of Systems:   Review of Systems  Constitutional: Positive for malaise/fatigue. Negative for chills, fever and weight loss.  HENT: Negative.   Eyes: Negative.   Respiratory: Negative.   Cardiovascular: Negative.   Gastrointestinal: Negative.   Genitourinary: Negative.   Musculoskeletal: Negative.   Skin: Negative.   Neurological: Positive for weakness.  Endo/Heme/Allergies: Negative.   Psychiatric/Behavioral: Negative.     Physical Exam:  Physical Exam  Constitutional: She is oriented to person, place, and time and well-developed, well-nourished, and in no distress. No distress.  She appears to have gained considerable weight from the last time I saw her.  No stridor comfortable appearing  HENT:  Head: Normocephalic and atraumatic.  Obese neck but very minimally palpable left-sided nodule  Eyes: Pupils are equal, round, and reactive to light. Right eye exhibits no discharge. Left eye exhibits no discharge. No scleral icterus.  Neck: Normal range of motion. No tracheal deviation present.  Cardiovascular: Normal rate, regular rhythm and normal heart sounds.  Pulmonary/Chest: Effort normal. No stridor. No respiratory distress.  Abdominal: Soft.  Musculoskeletal: Normal range of motion. She exhibits no edema or tenderness.  Lymphadenopathy:    She has no cervical adenopathy.  Neurological: She is alert and oriented to person, place, and time.  Skin: Skin is warm and dry. No rash noted. She is not diaphoretic. No erythema.  Psychiatric: Mood and affect normal.  Vitals reviewed.   Blood pressure (!) 146/101,  pulse 88, temperature 97.6 F (36.4 C), temperature source Oral, weight 225 lb (102.1 kg).    No results found for this or any previous visit (from the past 48 hour(s)). No results found.   Assessment/Plan Biopsy results reviewed.  Ultrasound report reviewed. I believe this represents a multinodular goiter.  Patient has not seen an  endocrinologist and and an endocrinologist may be able to help her with her being tired etc. I do not have laboratory values available to me but she states that they were normal.  Sometimes a normal panel can be adjusted with thyroid medication to make her less hypothyroid feeling.  There are no surgical indications at this time with the lack of findings to suggest a malignancy in her thyroid.  I offered a second opinion either with her existing ENT who she saw prior to this workup or with an endocrinologist or another surgeon.  She prefers to get her care in the [place] area now and we will obtain consultation with an endocrinologist.  Florene Glen, MD, FACS

## 2017-04-14 ENCOUNTER — Ambulatory Visit: Payer: BC Managed Care – PPO | Admitting: Endocrinology

## 2017-04-14 ENCOUNTER — Encounter: Payer: Self-pay | Admitting: Endocrinology

## 2017-04-14 DIAGNOSIS — E042 Nontoxic multinodular goiter: Secondary | ICD-10-CM

## 2017-04-14 NOTE — Patient Instructions (Signed)
Please see an ear-nose-throat specialist.  you will receive a phone call, about a day and time for an appointment.

## 2017-04-14 NOTE — Progress Notes (Signed)
Subjective:    Patient ID: Karen Chandler, female    DOB: 01-Oct-1995, 22 y.o.   MRN: [id number]  HPI Pt is ref by Lennie Odor, PA, for nodular goiter.  She states few mos of moderate pain at the ant neck, and assoc swelling.   Past Medical History:  Diagnosis Date  . Abdominal pain, epigastric   . Biliary colic   . Depression   . 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 Gastritis   . Inspiratory stridor 02/22/2017  . Seasonal allergies   . Wears contact lenses     Past Surgical History:  Procedure Laterality Date  . APPENDECTOMY  02/22/2013   Dr. Burt Knack  . CHOLECYSTECTOMY N/A 04/25/2015   Procedure: LAPAROSCOPIC CHOLECYSTECTOMY ;  Surgeon: Florene Glen, MD;  Location: [facility];  Service: General;  Laterality: N/A;  laparoscopic cholecystectomy  . ESOPHAGOGASTRODUODENOSCOPY (EGD) WITH PROPOFOL N/A 04/18/2015   Procedure: ESOPHAGOGASTRODUODENOSCOPY (EGD) WITH PROPOFOL;  Surgeon: Lucilla Lame, MD;  Location: [place];  Service: Endoscopy;  Laterality: N/A;  . WISDOM TOOTH EXTRACTION      Social History   Socioeconomic History  . Marital status: Single    Spouse name: Not on file  . Number of children: Not on file  . Years of education: Not on file  . Highest education level: Not on file  Social Needs  . Financial resource strain: Not on file  . Food insecurity - worry: Not on file  . Food insecurity - inability: Not on file  . Transportation needs - medical: Not on file  . Transportation needs - non-medical: Not on file  Occupational History  . Not on file  Tobacco Use  . Smoking status: Never Smoker  . Smokeless tobacco: Never Used  Substance and Sexual Activity  . Alcohol use: No  . Drug use: No  . Sexual activity: Not on file  Other Topics Concern  . Not on file  Social History Narrative  . Not on file    Current Outpatient Medications on File Prior to Visit  Medication Sig Dispense Refill  . FLUoxetine (PROZAC) 10 MG capsule Take 10 mg by  mouth daily. Take 10 mg by mouth. Taken with Fluoxetine 20 mg for a total of 30 mg    . FLUoxetine (PROZAC) 20 MG capsule Take 20 mg by mouth daily. Take 20 mg by mouth. Taken with Fluoxetine 10 mg for a total of 30 mg    . hydrOXYzine (ATARAX/VISTARIL) 25 MG tablet Take 25 mg by mouth daily as needed.     . Norgestimate-Ethinyl Estradiol Triphasic 0.18/0.215/0.25 MG-35 MCG tablet Take 1 tablet by mouth.     No current facility-administered medications on file prior to visit.     No Known Allergies  Family History  Problem Relation Age of Onset  . Hypertension Mother   . Anxiety disorder Mother   . Depression Mother   . Multiple sclerosis Father   . Hypertension Maternal Grandmother   . Heart disease Maternal Grandmother   . Heart disease Maternal Grandfather   . Diabetes Paternal Grandfather   . Thyroid disease Neg Hx     BP 122/82 (BP Location: Left Arm, Patient Position: Sitting, Cuff Size: Normal)   Pulse 85   Ht 5\' 6"  (1.676 m)   Wt 226 lb 9.6 oz (102.8 kg)   SpO2 98%   BMI 36.57 kg/m    Review of Systems Denies hoarseness, visual loss, chest pain, sob, cough, diarrhea, itching, flushing, easy bruising, cold  intolerance, headache, numbness, and rhinorrhea.  She has fatigue, dysphagia, and weight gain.  Depression is well-controlled    Objective:   Physical Exam VS: see vs page GEN: no distress HEAD: head: no deformity eyes: no periorbital swelling, no proptosis external nose and ears are normal mouth: no lesion seen NECK: slightly swollen at the left ant aspect, but I can't tell details. CHEST WALL: no deformity LUNGS: clear to auscultation CV: reg rate and rhythm, no murmur ABD: abdomen is soft, nontender.  no hepatosplenomegaly.  not distended.  no hernia MUSCULOSKELETAL: muscle bulk and strength are grossly normal.  no obvious joint swelling.  gait is normal and steady.  EXTEMITIES: no deformity.  no edema PULSES: no carotid bruit NEURO:  cn 2-12 grossly  intact.   readily moves all 4's.  sensation is intact to touch on all 4's SKIN:  Normal texture and temperature.  No rash or suspicious lesion is visible.   NODES:  None palpable at the neck PSYCH: alert, well-oriented.  Does not appear anxious nor depressed.  Out office has called to [facility] lab results from [facility]  [place]: 2.7 cm left inferior TR 3 nodule correlates with the CT finding and meets criteria for biopsy.  Cytology: Bet Cat 2      Assessment & Plan:  Nodular goiter, new to me.  Low risk for malignancy Neck sxs, uncertain etiology.  We discussed.  Pt states she would undergo thyroid surgery if necessary, to see if that would help theb pain.  I told pt that I don't know--we'll hase to ask ENT.  Patient Instructions  Please see an ear-nose-throat specialist.  you will receive a phone call, about a day and time for an appointment.

## 2017-04-22 ENCOUNTER — Ambulatory Visit: Payer: BC Managed Care – PPO | Admitting: Surgery

## 2017-05-10 ENCOUNTER — Telehealth: Payer: Self-pay | Admitting: Endocrinology

## 2017-05-10 NOTE — Telephone Encounter (Signed)
Dr. Marisue Humble would like to speak with you regarding patient. Please call Dr. Marisue Humble at ph# [phone]

## 2017-05-10 NOTE — Telephone Encounter (Signed)
I called back: I an unaware of any medical rx that would help.

## 2017-05-21 ENCOUNTER — Other Ambulatory Visit: Payer: Self-pay | Admitting: Otolaryngology

## 2017-06-02 NOTE — Pre-Procedure Instructions (Signed)
PHYLLISS STREGE  06/02/2017      [facility] [address], [place] - [address] [place] [address] Phone: [phone] Fax: [phone]    Your procedure is scheduled on Fri., April 5  Report to [facility] Admitting at 8:00 A.M.  Call this number if you have problems the morning of surgery:  [phone]   Remember:  Do not eat food or drink liquids after midnight on Thurs. April 4   Take these medicines the morning of surgery with A SIP OF WATER :              certirizine (zyrtec)              Fluoxetine (prozac)              Hydroxyzine (atarax/vistaril)              Norgestimate-ethinyl estradiol triphasic   Do not wear jewelry, make-up or nail polish.  Do not wear lotions, powders, or perfumes, or deodorant.  Do not shave 48 hours prior to surgery.  Men may shave face and neck.  Do not bring valuables to the hospital.  [facility] is not responsible for any belongings or valuables.  Contacts, dentures or bridgework may not be worn into surgery.  Leave your suitcase in the car.  After surgery it may be brought to your room.  For patients admitted to the hospital, discharge time will be determined by your treatment team.  Patients discharged the day of surgery will not be allowed to drive home.    Special instructions:  [place]- Preparing For Surgery  Before surgery, you can play an important role. Because skin is not sterile, your skin needs to be as free of germs as possible. You can reduce the number of germs on your skin by washing with CHG (chlorahexidine gluconate) Soap before surgery.  CHG is an antiseptic cleaner which kills germs and bonds with the skin to continue killing germs even after washing.  Please do not use if you have an allergy to CHG or antibacterial soaps. If your skin becomes reddened/irritated stop using the CHG.  Do not shave (including legs and underarms) for at least 48 hours prior to first CHG  shower. It is OK to shave your face.  Please follow these instructions carefully.   1. Shower the NIGHT BEFORE SURGERY and the MORNING OF SURGERY with CHG.   2. If you chose to wash your hair, wash your hair first as usual with your normal shampoo.  3. After you shampoo, rinse your hair and body thoroughly to remove the shampoo.  4. Use CHG as you would any other liquid soap. You can apply CHG directly to the skin and wash gently with a scrungie or a clean washcloth.   5. Apply the CHG Soap to your body ONLY FROM THE NECK DOWN.  Do not use on open wounds or open sores. Avoid contact with your eyes, ears, mouth and genitals (private parts). Wash Face and genitals (private parts)  with your normal soap.  6. Wash thoroughly, paying special attention to the area where your surgery will be performed.  7. Thoroughly rinse your body with warm water from the neck down.  8. DO NOT shower/wash with your normal soap after using and rinsing off the CHG Soap.  9. Pat yourself dry with a CLEAN TOWEL.  10. Wear CLEAN PAJAMAS to bed the night before surgery,  wear comfortable clothes the morning of surgery  11. Place CLEAN SHEETS on your bed the night of your first shower and DO NOT SLEEP WITH PETS.    Day of Surgery: Do not apply any deodorants/lotions. Please wear clean clothes to the hospital/surgery center.      Please read over the following fact sheets that you were given. Coughing and Deep Breathing and Surgical Site Infection Prevention

## 2017-06-02 NOTE — Pre-Procedure Instructions (Signed)
Karen Chandler  06/02/2017      [facility] [address], [place] - [address] [place] [address] Phone: [phone] Fax: [phone]    Your procedure is scheduled on April 5  Report to [facility] at 0800 A.M.  Call this number if you have problems the morning of surgery:  [phone]   Remember:  Do not eat food or drink liquids after midnight.  Continue all medications as directed by your physician except follow these medication instructions before surgery below   Take these medicines the morning of surgery with A SIP OF WATER  cetirizine (ZYRTEC) FLUoxetine (PROZAC) Norgestimate-Ethinyl Estradiol Triphasic   7 days prior to surgery STOP taking any Aspirin(unless otherwise instructed by your surgeon), Aleve, Naproxen, Ibuprofen, Motrin, Advil, Goody's, BC's, all herbal medications, fish oil, and all vitamins   Do not wear jewelry, make-up or nail polish.  Do not wear lotions, powders, or perfumes, or deodorant.  Do not shave 48 hours prior to surgery.    Do not bring valuables to the hospital.  [facility] is not responsible for any belongings or valuables.  Contacts, dentures or bridgework may not be worn into surgery.  Leave your suitcase in the car.  After surgery it may be brought to your room.  For patients admitted to the hospital, discharge time will be determined by your treatment team.  Patients discharged the day of surgery will not be allowed to drive home.    Special instructions:   [place]- Preparing For Surgery  Before surgery, you can play an important role. Because skin is not sterile, your skin needs to be as free of germs as possible. You can reduce the number of germs on your skin by washing with CHG (chlorahexidine gluconate) Soap before surgery.  CHG is an antiseptic cleaner which kills germs and bonds with the skin to continue killing germs even after washing.  Please do not use if  you have an allergy to CHG or antibacterial soaps. If your skin becomes reddened/irritated stop using the CHG.  Do not shave (including legs and underarms) for at least 48 hours prior to first CHG shower. It is OK to shave your face.  Please follow these instructions carefully.   1. Shower the NIGHT BEFORE SURGERY and the MORNING OF SURGERY with CHG.   2. If you chose to wash your hair, wash your hair first as usual with your normal shampoo.  3. After you shampoo, rinse your hair and body thoroughly to remove the shampoo.  4. Use CHG as you would any other liquid soap. You can apply CHG directly to the skin and wash gently with a scrungie or a clean washcloth.   5. Apply the CHG Soap to your body ONLY FROM THE NECK DOWN.  Do not use on open wounds or open sores. Avoid contact with your eyes, ears, mouth and genitals (private parts). Wash Face and genitals (private parts)  with your normal soap.  6. Wash thoroughly, paying special attention to the area where your surgery will be performed.  7. Thoroughly rinse your body with warm water from the neck down.  8. DO NOT shower/wash with your normal soap after using and rinsing off the CHG Soap.  9. Pat yourself dry with a CLEAN TOWEL.  10. Wear CLEAN PAJAMAS to bed the night before surgery, wear comfortable clothes the morning of surgery  11. Place CLEAN SHEETS on your bed  the night of your first shower and DO NOT SLEEP WITH PETS.    Day of Surgery: Do not apply any deodorants/lotions. Please wear clean clothes to the hospital/surgery center.      Please read over the following fact sheets that you were given.

## 2017-06-03 ENCOUNTER — Encounter (HOSPITAL_COMMUNITY): Payer: Self-pay

## 2017-06-03 ENCOUNTER — Ambulatory Visit (HOSPITAL_COMMUNITY)
Admission: RE | Admit: 2017-06-03 | Discharge: 2017-06-03 | Disposition: A | Discharge status: Home / Self Care | Payer: BC Managed Care – PPO | Source: Ambulatory Visit | Attending: Anesthesiology | Admitting: Anesthesiology

## 2017-06-03 ENCOUNTER — Other Ambulatory Visit: Payer: Self-pay

## 2017-06-03 ENCOUNTER — Encounter (HOSPITAL_COMMUNITY)
Admission: RE | Admit: 2017-06-03 | Discharge: 2017-06-03 | Disposition: A | Discharge status: Home / Self Care | Payer: BC Managed Care – PPO | Source: Ambulatory Visit | Attending: Otolaryngology | Admitting: Otolaryngology

## 2017-06-03 DIAGNOSIS — E890 Postprocedural hypothyroidism: Secondary | ICD-10-CM

## 2017-06-03 DIAGNOSIS — Z01812 Encounter for preprocedural laboratory examination: Secondary | ICD-10-CM | POA: Insufficient documentation

## 2017-06-03 DIAGNOSIS — Z01810 Encounter for preprocedural cardiovascular examination: Secondary | ICD-10-CM | POA: Insufficient documentation

## 2017-06-03 DIAGNOSIS — Z9889 Other specified postprocedural states: Secondary | ICD-10-CM

## 2017-06-03 HISTORY — DX: Anxiety disorder, unspecified: F41.9

## 2017-06-03 LAB — CBC
HCT: 38.4 % (ref 36.0–46.0)
Hemoglobin: 12.3 g/dL (ref 12.0–15.0)
MCH: 27.9 pg (ref 26.0–34.0)
MCHC: 32 g/dL (ref 30.0–36.0)
MCV: 87.1 fL (ref 78.0–100.0)
Platelets: 302 10*3/uL (ref 150–400)
RBC: 4.41 MIL/uL (ref 3.87–5.11)
RDW: 12.8 % (ref 11.5–15.5)
WBC: 7 10*3/uL (ref 4.0–10.5)

## 2017-06-03 NOTE — Progress Notes (Signed)
PCP - Gaynelle Arabian was seeing Barth Kirks [place] Cardiologist - denies  Chest x-ray - 06/03/17 EKG - denies Stress Test -denies  ECHO - denies Cardiac Cath - denies   Anesthesia review: No  Patient denies shortness of breath, fever, cough and chest pain at PAT appointment   Patient verbalized understanding of instructions that were given to them at the PAT appointment. Patient was also instructed that they will need to review over the PAT instructions again at home before surgery.

## 2017-06-11 ENCOUNTER — Observation Stay (HOSPITAL_COMMUNITY)
Admission: RE | Admit: 2017-06-11 | Discharge: 2017-06-12 | Disposition: A | Discharge status: Home / Self Care | Payer: BC Managed Care – PPO | Source: Ambulatory Visit | Attending: Otolaryngology | Admitting: Otolaryngology

## 2017-06-11 ENCOUNTER — Other Ambulatory Visit: Payer: Self-pay

## 2017-06-11 ENCOUNTER — Ambulatory Visit (HOSPITAL_COMMUNITY): Payer: BC Managed Care – PPO | Admitting: Certified Registered"

## 2017-06-11 ENCOUNTER — Encounter (HOSPITAL_COMMUNITY)
Admission: RE | Disposition: A | Discharge status: Home / Self Care | Payer: Self-pay | Source: Ambulatory Visit | Attending: Otolaryngology

## 2017-06-11 ENCOUNTER — Encounter (HOSPITAL_COMMUNITY): Payer: Self-pay | Admitting: *Deleted

## 2017-06-11 DIAGNOSIS — D34 Benign neoplasm of thyroid gland: Secondary | ICD-10-CM | POA: Diagnosis not present

## 2017-06-11 DIAGNOSIS — Z79899 Other long term (current) drug therapy: Secondary | ICD-10-CM | POA: Diagnosis not present

## 2017-06-11 DIAGNOSIS — F329 Major depressive disorder, single episode, unspecified: Secondary | ICD-10-CM | POA: Diagnosis not present

## 2017-06-11 DIAGNOSIS — C73 Malignant neoplasm of thyroid gland: Secondary | ICD-10-CM | POA: Diagnosis not present

## 2017-06-11 DIAGNOSIS — F419 Anxiety disorder, unspecified: Secondary | ICD-10-CM | POA: Insufficient documentation

## 2017-06-11 DIAGNOSIS — E063 Autoimmune thyroiditis: Secondary | ICD-10-CM | POA: Diagnosis not present

## 2017-06-11 DIAGNOSIS — E049 Nontoxic goiter, unspecified: Secondary | ICD-10-CM | POA: Diagnosis present

## 2017-06-11 HISTORY — PX: THYROIDECTOMY: SHX17

## 2017-06-11 SURGERY — THYROIDECTOMY
Anesthesia: General | Site: Neck | Laterality: Bilateral

## 2017-06-11 MED ORDER — HYDROCODONE-ACETAMINOPHEN 5-325 MG PO TABS: 1.0000 | ORAL_TABLET | Freq: Four times a day (QID) | ORAL | 0 refills | Status: AC | PRN

## 2017-06-11 MED ORDER — LIDOCAINE HCL (CARDIAC) 20 MG/ML IV SOLN
INTRAVENOUS | Status: AC
  Filled 2017-06-11: qty 5

## 2017-06-11 MED ORDER — LIDOCAINE-EPINEPHRINE 1 %-1:100000 IJ SOLN
INTRAMUSCULAR | Status: DC | PRN
  Administered 2017-06-11: 20 mL via INTRADERMAL

## 2017-06-11 MED ORDER — 0.9 % SODIUM CHLORIDE (POUR BTL) OPTIME
TOPICAL | Status: DC | PRN
  Administered 2017-06-11: 1000 mL

## 2017-06-11 MED ORDER — SUGAMMADEX SODIUM 200 MG/2ML IV SOLN
INTRAVENOUS | Status: DC | PRN
  Administered 2017-06-11: 250 mg via INTRAVENOUS

## 2017-06-11 MED ORDER — ONDANSETRON HCL 4 MG/2ML IJ SOLN
INTRAMUSCULAR | Status: AC
  Filled 2017-06-11: qty 2

## 2017-06-11 MED ORDER — NORGESTIM-ETH ESTRAD TRIPHASIC 0.18/0.215/0.25 MG-35 MCG PO TABS: 1.0000 | ORAL_TABLET | Freq: Every day | ORAL | Status: DC

## 2017-06-11 MED ORDER — FLUOXETINE HCL 20 MG PO CAPS
20.0000 mg | ORAL_CAPSULE | Freq: Every day | ORAL | Status: DC
  Administered 2017-06-12: 20 mg via ORAL
  Filled 2017-06-11: qty 1

## 2017-06-11 MED ORDER — MIDAZOLAM HCL 2 MG/2ML IJ SOLN
INTRAMUSCULAR | Status: AC
  Filled 2017-06-11: qty 2

## 2017-06-11 MED ORDER — HYDROXYZINE HCL 25 MG PO TABS: 25.0000 mg | ORAL_TABLET | Freq: Two times a day (BID) | ORAL | Status: DC | PRN

## 2017-06-11 MED ORDER — HYDROMORPHONE HCL 1 MG/ML IJ SOLN
0.2500 mg | INTRAMUSCULAR | Status: DC | PRN
  Administered 2017-06-11: 0.5 mg via INTRAVENOUS

## 2017-06-11 MED ORDER — MORPHINE SULFATE (PF) 4 MG/ML IV SOLN
2.0000 mg | INTRAVENOUS | Status: DC | PRN
  Administered 2017-06-11: 4 mg via INTRAVENOUS
  Filled 2017-06-11: qty 1

## 2017-06-11 MED ORDER — FENTANYL CITRATE (PF) 100 MCG/2ML IJ SOLN
INTRAMUSCULAR | Status: DC | PRN
  Administered 2017-06-11: 150 ug via INTRAVENOUS
  Administered 2017-06-11 (×2): 50 ug via INTRAVENOUS

## 2017-06-11 MED ORDER — HEMOSTATIC AGENTS (NO CHARGE) OPTIME
TOPICAL | Status: DC | PRN
  Administered 2017-06-11: 1 via TOPICAL

## 2017-06-11 MED ORDER — ONDANSETRON HCL 4 MG/2ML IJ SOLN
INTRAMUSCULAR | Status: DC | PRN
  Administered 2017-06-11: 4 mg via INTRAVENOUS

## 2017-06-11 MED ORDER — MIDAZOLAM HCL 5 MG/5ML IJ SOLN
INTRAMUSCULAR | Status: DC | PRN
  Administered 2017-06-11: 2 mg via INTRAVENOUS

## 2017-06-11 MED ORDER — SUGAMMADEX SODIUM 200 MG/2ML IV SOLN
INTRAVENOUS | Status: AC
  Filled 2017-06-11: qty 2

## 2017-06-11 MED ORDER — DEXAMETHASONE SODIUM PHOSPHATE 10 MG/ML IJ SOLN
INTRAMUSCULAR | Status: DC | PRN
  Administered 2017-06-11: 10 mg via INTRAVENOUS

## 2017-06-11 MED ORDER — PROPOFOL 10 MG/ML IV BOLUS
INTRAVENOUS | Status: AC
  Filled 2017-06-11: qty 20

## 2017-06-11 MED ORDER — LIDOCAINE-EPINEPHRINE 1 %-1:100000 IJ SOLN
INTRAMUSCULAR | Status: AC
  Filled 2017-06-11: qty 1

## 2017-06-11 MED ORDER — LACTATED RINGERS IV SOLN
INTRAVENOUS | Status: DC
  Administered 2017-06-11 (×2): via INTRAVENOUS

## 2017-06-11 MED ORDER — DEXAMETHASONE SODIUM PHOSPHATE 10 MG/ML IJ SOLN
INTRAMUSCULAR | Status: AC
  Filled 2017-06-11: qty 1

## 2017-06-11 MED ORDER — CALCIUM CARBONATE 1250 (500 CA) MG PO TABS
1000.0000 mg | ORAL_TABLET | Freq: Three times a day (TID) | ORAL | Status: DC
  Administered 2017-06-11 – 2017-06-12 (×3): 1000 mg via ORAL
  Filled 2017-06-11 (×3): qty 1

## 2017-06-11 MED ORDER — LEVOTHYROXINE SODIUM 150 MCG PO TABS: 150.0000 ug | ORAL_TABLET | Freq: Every day | ORAL | 11 refills | Status: DC

## 2017-06-11 MED ORDER — HYDROMORPHONE HCL 1 MG/ML IJ SOLN
INTRAMUSCULAR | Status: AC
  Administered 2017-06-11: 0.5 mg via INTRAVENOUS
  Filled 2017-06-11: qty 1

## 2017-06-11 MED ORDER — ROCURONIUM BROMIDE 10 MG/ML (PF) SYRINGE
PREFILLED_SYRINGE | INTRAVENOUS | Status: AC
  Filled 2017-06-11: qty 5

## 2017-06-11 MED ORDER — LEVOTHYROXINE SODIUM 75 MCG PO TABS
150.0000 ug | ORAL_TABLET | Freq: Every day | ORAL | Status: DC
  Administered 2017-06-12: 150 ug via ORAL
  Filled 2017-06-11: qty 2

## 2017-06-11 MED ORDER — HYDROCODONE-ACETAMINOPHEN 5-325 MG PO TABS
1.0000 | ORAL_TABLET | ORAL | Status: DC | PRN
  Administered 2017-06-11 – 2017-06-12 (×4): 2 via ORAL
  Filled 2017-06-11 (×4): qty 2

## 2017-06-11 MED ORDER — FLUOXETINE HCL 10 MG PO CAPS
10.0000 mg | ORAL_CAPSULE | Freq: Every day | ORAL | Status: DC
  Administered 2017-06-12: 10 mg via ORAL
  Filled 2017-06-11: qty 1

## 2017-06-11 MED ORDER — PROPOFOL 10 MG/ML IV BOLUS
INTRAVENOUS | Status: DC | PRN
  Administered 2017-06-11: 200 mg via INTRAVENOUS

## 2017-06-11 MED ORDER — FENTANYL CITRATE (PF) 250 MCG/5ML IJ SOLN
INTRAMUSCULAR | Status: AC
  Filled 2017-06-11: qty 5

## 2017-06-11 MED ORDER — ROCURONIUM BROMIDE 100 MG/10ML IV SOLN
INTRAVENOUS | Status: DC | PRN
  Administered 2017-06-11: 20 mg via INTRAVENOUS
  Administered 2017-06-11: 50 mg via INTRAVENOUS

## 2017-06-11 MED ORDER — KCL IN DEXTROSE-NACL 20-5-0.45 MEQ/L-%-% IV SOLN
INTRAVENOUS | Status: DC
  Administered 2017-06-11: 14:00:00 via INTRAVENOUS
  Filled 2017-06-11: qty 1000

## 2017-06-11 MED ORDER — CALCIUM CARBONATE-VITAMIN D 500-200 MG-UNIT PO TABS: 1.0000 | ORAL_TABLET | Freq: Two times a day (BID) | ORAL | 3 refills | Status: AC

## 2017-06-11 MED ORDER — CEFAZOLIN SODIUM-DEXTROSE 2-4 GM/100ML-% IV SOLN
2.0000 g | INTRAVENOUS | Status: AC
  Administered 2017-06-11: 2 g via INTRAVENOUS

## 2017-06-11 MED ORDER — LIDOCAINE HCL (CARDIAC) 20 MG/ML IV SOLN
INTRAVENOUS | Status: DC | PRN
  Administered 2017-06-11: 100 mg via INTRAVENOUS

## 2017-06-11 MED ORDER — CEFAZOLIN SODIUM-DEXTROSE 2-4 GM/100ML-% IV SOLN
INTRAVENOUS | Status: AC
  Filled 2017-06-11: qty 100

## 2017-06-11 SURGICAL SUPPLY — 51 items
BLADE SURG 15 STRL LF DISP TIS (BLADE) IMPLANT
BLADE SURG 15 STRL SS (BLADE)
CANISTER SUCT 3000ML PPV (MISCELLANEOUS) ×3 IMPLANT
CLEANER TIP ELECTROSURG 2X2 (MISCELLANEOUS) ×3 IMPLANT
CONT SPEC 4OZ CLIKSEAL STRL BL (MISCELLANEOUS) ×3 IMPLANT
CORDS BIPOLAR (ELECTRODE) ×3 IMPLANT
COVER SURGICAL LIGHT HANDLE (MISCELLANEOUS) ×3 IMPLANT
CRADLE DONUT ADULT HEAD (MISCELLANEOUS) ×3 IMPLANT
DERMABOND ADVANCED (GAUZE/BANDAGES/DRESSINGS) ×2
DERMABOND ADVANCED .7 DNX12 (GAUZE/BANDAGES/DRESSINGS) ×1 IMPLANT
DRAIN JACKSON RD 7FR 3/32 (WOUND CARE) IMPLANT
DRAIN SNY 10 ROU (WOUND CARE) IMPLANT
DRAPE HALF SHEET 40X57 (DRAPES) IMPLANT
ELECT COATED BLADE 2.86 ST (ELECTRODE) ×3 IMPLANT
ELECT REM PT RETURN 9FT ADLT (ELECTROSURGICAL) ×3
ELECTRODE REM PT RTRN 9FT ADLT (ELECTROSURGICAL) ×1 IMPLANT
EVACUATOR SILICONE 100CC (DRAIN) ×3 IMPLANT
FORCEPS BIPOLAR SPETZLER 8 1.0 (NEUROSURGERY SUPPLIES) ×3 IMPLANT
GAUZE SPONGE 4X4 16PLY XRAY LF (GAUZE/BANDAGES/DRESSINGS) ×3 IMPLANT
GLOVE BIO SURGEON STRL SZ7.5 (GLOVE) ×3 IMPLANT
GLOVE BIOGEL PI IND STRL 6.5 (GLOVE) ×1 IMPLANT
GLOVE BIOGEL PI IND STRL 7.5 (GLOVE) ×1 IMPLANT
GLOVE BIOGEL PI IND STRL 8 (GLOVE) ×1 IMPLANT
GLOVE BIOGEL PI INDICATOR 6.5 (GLOVE) ×2
GLOVE BIOGEL PI INDICATOR 7.5 (GLOVE) ×2
GLOVE BIOGEL PI INDICATOR 8 (GLOVE) ×2
GLOVE ECLIPSE 8.0 STRL XLNG CF (GLOVE) ×3 IMPLANT
GLOVE SURG SS PI 7.0 STRL IVOR (GLOVE) ×3 IMPLANT
GOWN STRL REUS W/ TWL LRG LVL3 (GOWN DISPOSABLE) ×2 IMPLANT
GOWN STRL REUS W/ TWL XL LVL3 (GOWN DISPOSABLE) ×2 IMPLANT
GOWN STRL REUS W/TWL LRG LVL3 (GOWN DISPOSABLE) ×4
GOWN STRL REUS W/TWL XL LVL3 (GOWN DISPOSABLE) ×4
HEMOSTAT SURGICEL 2X14 (HEMOSTASIS) ×3 IMPLANT
KIT BASIN OR (CUSTOM PROCEDURE TRAY) ×3 IMPLANT
KIT TURNOVER KIT B (KITS) ×3 IMPLANT
LOCATOR NERVE 3 VOLT (DISPOSABLE) IMPLANT
NEEDLE HYPO 25GX1X1/2 BEV (NEEDLE) ×3 IMPLANT
NS IRRIG 1000ML POUR BTL (IV SOLUTION) ×3 IMPLANT
PAD ARMBOARD 7.5X6 YLW CONV (MISCELLANEOUS) ×3 IMPLANT
PENCIL BUTTON HOLSTER BLD 10FT (ELECTRODE) ×3 IMPLANT
SHEARS HARMONIC 9CM CVD (BLADE) ×3 IMPLANT
SPONGE INTESTINAL PEANUT (DISPOSABLE) ×3 IMPLANT
STAPLER VISISTAT 35W (STAPLE) ×3 IMPLANT
SUT ETHILON 2 0 FS 18 (SUTURE) ×3 IMPLANT
SUT SILK 3 0 REEL (SUTURE) ×3 IMPLANT
SUT VIC AB 3-0 SH 27 (SUTURE) ×2
SUT VIC AB 3-0 SH 27X BRD (SUTURE) ×1 IMPLANT
SUT VICRYL 4-0 PS2 18IN ABS (SUTURE) ×6 IMPLANT
TOWEL OR 17X24 6PK STRL BLUE (TOWEL DISPOSABLE) ×3 IMPLANT
TRAY ENT MC OR (CUSTOM PROCEDURE TRAY) ×3 IMPLANT
TUBE FEEDING 10FR FLEXIFLO (MISCELLANEOUS) IMPLANT

## 2017-06-11 NOTE — H&P (Signed)
Karen Chandler is an 22 y.o. female.   Chief Complaint: Thyroid enlargement and pain HPI: 22 year old female with benign thyroid nodule but ongoing pain in the thyroid.  After discussing options, she wishes to proceed with thyroidectomy.  Past Medical History:  Diagnosis Date  . Abdominal pain, epigastric   . Anxiety   . Biliary colic   . Depression   . 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 Gastritis   . Inspiratory stridor 02/22/2017  . Seasonal allergies   . Wears contact lenses     Past Surgical History:  Procedure Laterality Date  . APPENDECTOMY  02/22/2013   Dr. Burt Knack  . CHOLECYSTECTOMY N/A 04/25/2015   Procedure: LAPAROSCOPIC CHOLECYSTECTOMY ;  Surgeon: Florene Glen, MD;  Location: [facility];  Service: General;  Laterality: N/A;  laparoscopic cholecystectomy  . ESOPHAGOGASTRODUODENOSCOPY (EGD) WITH PROPOFOL N/A 04/18/2015   Procedure: ESOPHAGOGASTRODUODENOSCOPY (EGD) WITH PROPOFOL;  Surgeon: Lucilla Lame, MD;  Location: [place] [facility] [place];  Service: Endoscopy;  Laterality: N/A;  . WISDOM TOOTH EXTRACTION      Family History  Problem Relation Age of Onset  . Hypertension Mother   . Anxiety disorder Mother   . Depression Mother   . Multiple sclerosis Father   . Hypertension Maternal Grandmother   . Heart disease Maternal Grandmother   . Heart disease Maternal Grandfather   . Diabetes Paternal Grandfather   . Thyroid disease Neg Hx    Social History:  reports that she has never smoked. She has never used smokeless tobacco. She reports that she drinks alcohol. She reports that she does not use drugs.  Allergies: No Known Allergies  Medications Prior to Admission  Medication Sig Dispense Refill  . cetirizine (ZYRTEC) 10 MG tablet Take 10 mg by mouth daily.    Marland Kitchen FLUoxetine (PROZAC) 10 MG capsule Take 10 mg by mouth daily. Take 10 mg by mouth. Taken with Fluoxetine 20 mg for a total of 30 mg    . FLUoxetine (PROZAC) 20 MG capsule Take 20 mg by mouth daily. Take 20  mg by mouth. Taken with Fluoxetine 10 mg for a total of 30 mg    . Norgestimate-Ethinyl Estradiol Triphasic 0.18/0.215/0.25 MG-35 MCG tablet Take 1 tablet by mouth.    . hydrOXYzine (ATARAX/VISTARIL) 25 MG tablet Take 25 mg by mouth 2 (two) times daily as needed (FOR ANXIETY.).     Marland Kitchen ibuprofen (ADVIL,MOTRIN) 200 MG tablet Take 400 mg by mouth every 8 (eight) hours as needed (for pain/headaches.).      No results found for this or any previous visit (from the past 48 hour(s)). No results found.  Review of Systems  All other systems reviewed and are negative.   Blood pressure 127/69, pulse 74, temperature 97.9 F (36.6 C), temperature source Oral, resp. rate 20, height 5\' 6"  (1.676 m), weight 227 lb (103 kg), last menstrual period 05/19/2017, SpO2 99 %. Physical Exam  Constitutional: She is oriented to person, place, and time. She appears well-developed and well-nourished. No distress.  HENT:  Head: Normocephalic and atraumatic.  Right Ear: External ear normal.  Left Ear: External ear normal.  Nose: Nose normal.  Mouth/Throat: Oropharynx is clear and moist.  Eyes: Pupils are equal, round, and reactive to light. Conjunctivae and EOM are normal.  Neck: Normal range of motion. Neck supple.  Thyroid area tender.  Cardiovascular: Normal rate.  Respiratory: Effort normal.  Musculoskeletal: Normal range of motion.  Neurological: She is alert and oriented to person, place, and time.  Skin: Skin is warm and dry.  Psychiatric: She has a normal mood and affect. Her behavior is normal. Judgment and thought content normal.     Assessment/Plan Thyroid enlargement and pain To OR for total thyroidectomy.  Melida Quitter, MD 06/11/2017, 9:43 AM

## 2017-06-11 NOTE — Transfer of Care (Signed)
Immediate Anesthesia Transfer of Care Note  Patient: Karen Chandler  Procedure(s) Performed: THYROIDECTOMY (Bilateral Neck)  Patient Location: PACU  Anesthesia Type:General  Level of Consciousness: awake, alert  and oriented  Airway & Oxygen Therapy: Patient Spontanous Breathing and Patient connected to nasal cannula oxygen  Post-op Assessment: Report given to RN and Post -op Vital signs reviewed and stable  Post vital signs: Reviewed and stable  Last Vitals:  Vitals Value Taken Time  BP 134/75 06/11/2017 12:34 PM  Temp    Pulse 91 06/11/2017 12:34 PM  Resp 12 06/11/2017 12:34 PM  SpO2 99 % 06/11/2017 12:34 PM  Vitals shown include unvalidated device data.  Last Pain:  Vitals:   06/11/17 0828  TempSrc:   PainSc: 7          Complications: No apparent anesthesia complications

## 2017-06-11 NOTE — Progress Notes (Signed)
ENT Post Operative Note  Subjective: No nausea, vomiting No difficulty voiding Pain well controlled No sx of hypocalcemia  Vitals:   06/11/17 1334 06/11/17 1404  BP: (!) 145/87 (!) 142/86  Pulse: 94 94  Resp: 17 16  Temp: 97.7 F (36.5 C) 98.2 F (36.8 C)  SpO2: 96% 96%     OBJECTIVE  Gen: alert, cooperative, appropriate Head/ENT: EOMI, neck supple, mucus membranes moist and pink, conjunctiva clear Incisions c/d/i Face moves symmetrically Respiratory: Voice without dysphonia. non-labored breathing, no accessory muscle use, normal HR, good O2 saturations   ASSESS/ PLAN  Karen Chandler is a 22 y.o. female who is POD 0 from total thyroidectomy.  -pain control -MIVF- will saline lock when tolerating PO intake -wound care, routine  @SIGNATURE @

## 2017-06-11 NOTE — Anesthesia Postprocedure Evaluation (Signed)
Anesthesia Post Note  Patient: Karen Chandler  Procedure(s) Performed: THYROIDECTOMY (Bilateral Neck)     Patient location during evaluation: PACU Anesthesia Type: General Level of consciousness: awake and alert Pain management: pain level controlled Vital Signs Assessment: post-procedure vital signs reviewed and stable Respiratory status: spontaneous breathing, nonlabored ventilation, respiratory function stable and patient connected to nasal cannula oxygen Cardiovascular status: blood pressure returned to baseline and stable Postop Assessment: no apparent nausea or vomiting Anesthetic complications: no    Last Vitals:  Vitals:   06/11/17 1334 06/11/17 1404  BP: (!) 145/87 (!) 142/86  Pulse: 94 94  Resp: 17 16  Temp: 36.5 C 36.8 C  SpO2: 96% 96%    Last Pain:  Vitals:   06/11/17 1404  TempSrc: Oral  PainSc:                  Riccardo Dubin

## 2017-06-11 NOTE — Progress Notes (Signed)
Patient arrived to 6n26 A&Ox4, VSS, and IV intact.  Noted to have midline incision on neck with skin glue closure.  Denies pain at this time.  No family at bedside.  Patient oriented to room and equipment.  Will continue to monitor.

## 2017-06-11 NOTE — Anesthesia Procedure Notes (Signed)

## 2017-06-11 NOTE — Anesthesia Preprocedure Evaluation (Addendum)
Anesthesia Evaluation  Patient identified by MRN, date of birth, ID band Patient awake    Reviewed: Allergy & Precautions, H&P , NPO status , Patient's Chart, lab work & pertinent test results, reviewed documented beta blocker date and time   Airway Mallampati: II  TM Distance: >3 FB Neck ROM: full    Dental no notable dental hx. (+) Teeth Intact   Pulmonary neg pulmonary ROS,    Pulmonary exam normal breath sounds clear to auscultation       Cardiovascular negative cardio ROS Normal cardiovascular exam Rhythm:regular Rate:Normal     Neuro/Psych negative neurological ROS  negative psych ROS   GI/Hepatic negative GI ROS, Neg liver ROS,   Endo/Other  negative endocrine ROS  Renal/GU negative Renal ROS  negative genitourinary   Musculoskeletal   Abdominal   Peds  Hematology negative hematology ROS (+)   Anesthesia Other Findings    Reproductive/Obstetrics negative OB ROS                            Anesthesia Physical  Anesthesia Plan  ASA: III  Anesthesia Plan: General   Post-op Pain Management:    Induction: Intravenous  PONV Risk Score and Plan: 2 and Dexamethasone, Ondansetron and Treatment may vary due to age or medical condition  Airway Management Planned: Oral ETT  Additional Equipment:   Intra-op Plan:   Post-operative Plan: Extubation in OR  Informed Consent: I have reviewed the patients History and Physical, chart, labs and discussed the procedure including the risks, benefits and alternatives for the proposed anesthesia with the patient or authorized representative who has indicated his/her understanding and acceptance.   Dental Advisory Given  Plan Discussed with: CRNA  Anesthesia Plan Comments: (  )        Anesthesia Quick Evaluation

## 2017-06-11 NOTE — Brief Op Note (Signed)
06/11/2017  12:08 PM  PATIENT:  Karen Chandler  22 y.o. female  PRE-OPERATIVE DIAGNOSIS:  Enlarged thyroid  POST-OPERATIVE DIAGNOSIS:  Enlarged thyroid  PROCEDURE:  Procedure(s): THYROIDECTOMY (Bilateral)  SURGEON:  Surgeon(s) and Role:    Melida Quitter, MD - Primary  PHYSICIAN ASSISTANT: Sallee Provencal  ASSISTANTS: none   ANESTHESIA:   general  EBL:  25 mL   BLOOD ADMINISTERED:none  DRAINS: none   LOCAL MEDICATIONS USED:  LIDOCAINE   SPECIMEN:  Source of Specimen:  Total thyroid  DISPOSITION OF SPECIMEN:  PATHOLOGY  COUNTS:  YES  TOURNIQUET:  * No tourniquets in log *  DICTATION: .Other Dictation: Dictation Number [id number]  PLAN OF CARE: Admit for overnight observation  PATIENT DISPOSITION:  PACU - hemodynamically stable.   Delay start of Pharmacological VTE agent (>24hrs) due to surgical blood loss or risk of bleeding: yes

## 2017-06-11 NOTE — Op Note (Signed)
NAME:  Karen Chandler, Karen Chandler NO.:  MEDICAL RECORD NO.:  [id number]  LOCATION:                                 FACILITY:  PHYSICIAN:  Onnie Graham, MD     DATE OF BIRTH:  1996/01/23  DATE OF PROCEDURE:  06/11/2017 DATE OF DISCHARGE:                              OPERATIVE REPORT   PREOPERATIVE DIAGNOSIS:  Thyroid enlargement and thyroid pain.  POSTOPERATIVE DIAGNOSIS:  Thyroid enlargement and thyroid pain.  PROCEDURE:  Total thyroidectomy.  SURGEON:  Onnie Graham, MD.  ANESTHESIA:  General endotracheal anesthesia.  COMPLICATIONS:  None.  INDICATION:  The patient is a 22 year old female, who has a benign thyroid nodule by FNA, but also persisting thyroid pain that has not responded to medical therapy.  She wishes to proceed with thyroidectomy and presents to the operating room for surgical management and repair.  FINDINGS:  The thyroid gland was fairly normal in appearance grossly. It was sent to pathology.  DESCRIPTION OF PROCEDURE:  The patient was identified in the holding room and informed consent having been obtained including discussion of risks, benefits, alternatives, the patient was brought to the operative suite and put on the operating table in supine position.  Anesthesia was induced.  The patient was intubated by the Anesthesia team without difficulty.  The patient was given intravenous antibiotics during the case.  The eyes were taped closed and a shoulder roll was placed.  The neck incision was marked with a marking pen, injected with 1% lidocaine with 1:100,000 epinephrine.  The neck was prepped and draped in sterile fashion.  The incision was made with a 15 blade scalpel through the skin and extended through subcutaneous and platysma layer using Bovie electrocautery.  Dissection was continued down to the midline raphe of the strap muscles, which was divided.  The left-sided strap muscles were then elevated, exposing the left thyroid  lobe.  Careful dissection was then performed around the periphery of the gland staying on the capsule and using the Harmonic scalpel primarily.  This included dissection of the superior pedicle, where a parathyroid gland was encountered and dissected free from the thyroid gland and left pedicled on a fairly thin pedicle of soft tissue.  The superior pedicle of the thyroid gland was then divided with the Harmonic scalpel.  Dissection around the lateral extent of the lobe was then performed and the lobe was retracted medially.  Further dissection inferiorly freed the other parathyroid gland.  The recurrent laryngeal nerve was identified along the deep extent of the gland and kept intact and safe.  Dissection continued into Berry's ligament.  At this point, the same procedure was then carried on the right side, where both parathyroid glands were dissected free and the superior pedicle divided followed by dissection along the lateral and inferior extent of the lobe.  Also on this side, the recurrent nerve was identified and kept intact while the gland was further dissected to Berry's ligament.  The full gland was then removed from Berry's ligament using the Harmonic scalpel and passed to nursing for pathology. Bleeding was then controlled with bipolar electrocautery.  The wound was copiously irrigated with  saline.  A piece of Surgicel was placed in each thyroid bed and the self-retaining retractor was removed.  The platysma layer was then closed with 3-0 Vicryl suture in a simple interrupted fashion.  The subcutaneous layer was closed with 4-0 Vicryl suture in a simple running fashion.  The skin was closed with Dermabond. At this point, the patient was returned to Anesthesia for wake-up and was extubated in the recovery room in stable condition.     Onnie Graham, MD     DDB/MEDQ  D:  06/11/2017  T:  06/11/2017  Job:  [id number]

## 2017-06-12 ENCOUNTER — Encounter (HOSPITAL_COMMUNITY): Payer: Self-pay | Admitting: Otolaryngology

## 2017-06-12 DIAGNOSIS — C73 Malignant neoplasm of thyroid gland: Secondary | ICD-10-CM | POA: Diagnosis not present

## 2017-06-12 NOTE — Progress Notes (Signed)
Patient ID: Karen Chandler, female   DOB: 08/31/1995, 22 y.o.   MRN: [id number] ENT Post Operative Note  Subjective: No nausea, vomiting No difficulty voiding Pain well controlled No sx of hypocalcemia, no tingling or numbness Voice normal Uneventful night  Vitals:   06/12/17 0604 06/12/17 0929  BP: 123/86 138/90  Pulse: 80 83  Resp:    Temp: 98 F (36.7 C) 98.2 F (36.8 C)  SpO2: 99% 100%     OBJECTIVE  Gen: alert, cooperative, appropriate Head/ENT: EOMI, neck supple, mucus membranes moist and pink, conjunctiva clear Incisions c/d/i Face moves symmetrically Respiratory: Voice without dysphonia. non-labored breathing, no accessory muscle use, normal HR, good O2 saturations   ASSESS/ PLAN  Karen Chandler is a 22 y.o. female who is POD 0 from total thyroidectomy.  -DC home   Helayne Seminole

## 2017-06-12 NOTE — Discharge Summary (Signed)
Physician Discharge Summary  Patient ID: Karen Chandler MRN: [id number] DOB/AGE: 1996-01-23 22 y.o.  Admit date: 06/11/2017 Discharge date: 06/12/2017  Admission Diagnoses:  Discharge Diagnoses:  Active Problems:   Thyroid enlargement   Discharged Condition: good  Hospital Course: Patient underwent total thyroidectomy on 06/11/17. Transferred to floor post operatively. She had an uneventful course and was sent home on POD#1.   Consults: None  Significant Diagnostic Studies: None   Treatments: IV hydration, analgesia: Norco and Synthyroid, Calcium + Vit D  Discharge Exam: Blood pressure 138/90, pulse 83, temperature 98.2 F (36.8 C), temperature source Oral, resp. rate 16, height 5\' 6"  (1.676 m), weight 104.3 kg (229 lb 15 oz), last menstrual period 05/19/2017, SpO2 100 %. General appearance: alert, cooperative and appears stated age Head: Normocephalic, without obvious abnormality, atraumatic Eyes: conjunctivae/corneas clear. PERRL, EOM's intact. Fundi benign. Ears: Normal EACs and normal hearing Nose: Nares normal. Septum midline. Mucosa normal. No drainage or sinus tenderness. Throat: lips, mucosa, and tongue normal; teeth and gums normal Neck: no adenopathy, no carotid bruit, supple, symmetrical, trachea midline and incision c/d/i, no hematoma  Abdomen: soft, nondistended Moving all 4 extremities No stridor, non-labored respirations  Disposition: Discharge disposition: 01-Home or Self Care       Discharge Instructions    Diet - low sodium heart healthy   Complete by:  As directed    Discharge diet:   Complete by:  As directed    Regular   Discharge instructions   Complete by:  As directed    Regular diet.  Avoid strenuous activity.  OK to allow incision to get wet, gently pat dry.  Do not apply ointment to the incision.  Call with tingling of face or extremities or muscle spasms of hands or feet.  Call with fever or significant neck swelling.   Increase activity  slowly   Complete by:  As directed      Allergies as of 06/12/2017   No Known Allergies     Medication List    TAKE these medications   calcium-vitamin D 500-200 MG-UNIT tablet Commonly known as:  OSCAL 500/200 D-3 Take 1 tablet by mouth 2 (two) times daily.   cetirizine 10 MG tablet Commonly known as:  ZYRTEC Take 10 mg by mouth daily.   FLUoxetine 10 MG capsule Commonly known as:  PROZAC Take 10 mg by mouth daily. Take 10 mg by mouth. Taken with Fluoxetine 20 mg for a total of 30 mg   FLUoxetine 20 MG capsule Commonly known as:  PROZAC Take 20 mg by mouth daily. Take 20 mg by mouth. Taken with Fluoxetine 10 mg for a total of 30 mg   HYDROcodone-acetaminophen 5-325 MG tablet Commonly known as:  NORCO/VICODIN Take 1-2 tablets by mouth every 6 (six) hours as needed for moderate pain.   hydrOXYzine 25 MG tablet Commonly known as:  ATARAX/VISTARIL Take 25 mg by mouth 2 (two) times daily as needed (FOR ANXIETY.).   ibuprofen 200 MG tablet Commonly known as:  ADVIL,MOTRIN Take 400 mg by mouth every 8 (eight) hours as needed (for pain/headaches.).   levothyroxine 150 MCG tablet Commonly known as:  SYNTHROID Take 1 tablet (150 mcg total) by mouth daily.   Norgestimate-Ethinyl Estradiol Triphasic 0.18/0.215/0.25 MG-35 MCG tablet Take 1 tablet by mouth.      Follow-up Information    Melida Quitter, MD. Schedule an appointment as soon as possible for a visit in 1 week.   Specialty:  Otolaryngology Contact information: [address]  [place] [address] [phone]           Signed: Helayne Seminole 06/12/2017, 9:38 AM

## 2017-06-12 NOTE — Discharge Instructions (Signed)
-  Keep incision dry at all times.  -No strenuous exercise or heavy lifting until 14 days after surgery. Walk/ambulate at home often. Perform calf and stretch exercises often to prevent blood clot formation.  -Do not take more pain medication than prescribed. You should begin slowly weaning off the pain medication. No refills will be prescribed.  -Call us if you develop worsening swelling, difficulty breathing or swallowing, tingling or numbness around your lips or fingertips.    [place], [facility] Provider Office [phone]

## 2017-07-26 ENCOUNTER — Other Ambulatory Visit: Payer: Self-pay | Admitting: Internal Medicine

## 2017-07-26 ENCOUNTER — Ambulatory Visit
Admission: RE | Admit: 2017-07-26 | Discharge: 2017-07-26 | Disposition: A | Discharge status: Home / Self Care | Payer: BC Managed Care – PPO | Source: Ambulatory Visit | Attending: Internal Medicine | Admitting: Internal Medicine

## 2017-07-26 DIAGNOSIS — C73 Malignant neoplasm of thyroid gland: Secondary | ICD-10-CM

## 2017-12-15 ENCOUNTER — Ambulatory Visit: Payer: BC Managed Care – PPO | Admitting: Cardiology

## 2017-12-16 ENCOUNTER — Encounter

## 2017-12-16 ENCOUNTER — Ambulatory Visit: Payer: BC Managed Care – PPO | Admitting: Cardiovascular Disease

## 2017-12-25 IMAGING — CT CT NECK W/ CM
2 of 12 series · 6 of 33 positions shown, 7 images · IV contrast (ISOVUE)
Comparison: Neck radiographs 02/19/2017

CLINICAL DATA: Sore throat.  Bilateral ear pain.

EXAM:
CT NECK WITH CONTRAST
TECHNIQUE: Multidetector CT imaging of the neck was performed using the
standard protocol following the bolus administration of intravenous
contrast.
CONTRAST:  100mL 3T04V6-N5V IOPAMIDOL (3T04V6-N5V) INJECTION 76%,
75mL 3T04V6-N5V IOPAMIDOL (3T04V6-N5V) INJECTION 76%

[Series 18: thins · axial · 0.69mm/px · z∈[+1387,+1516]mm · 3 of 259 slices shown, 4 images]
[im 65/259  soft-tissue]
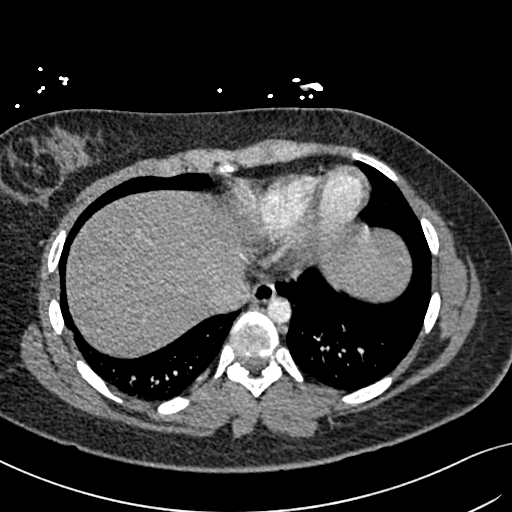
[im 65/259  bone]
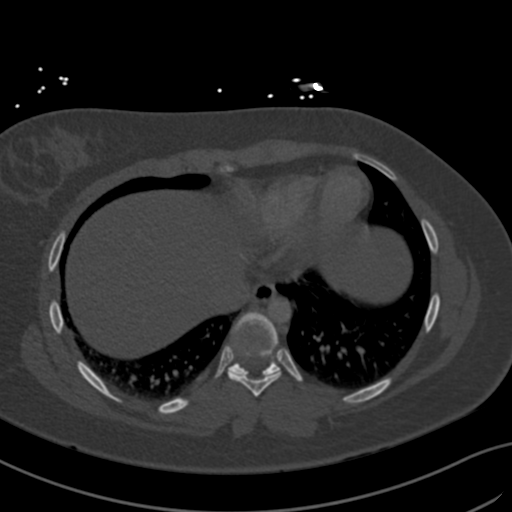
[im 130/259  bone]
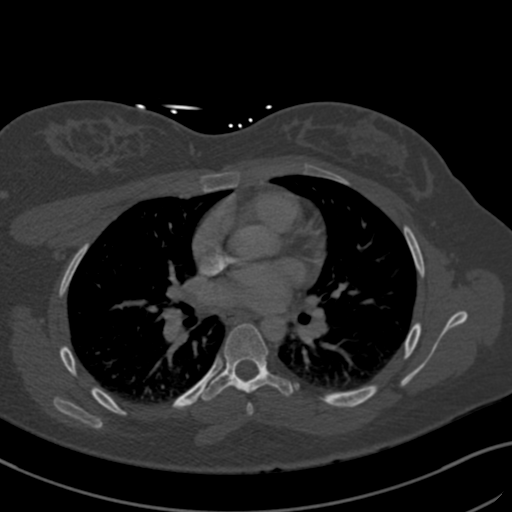
[im 194/259  bone]
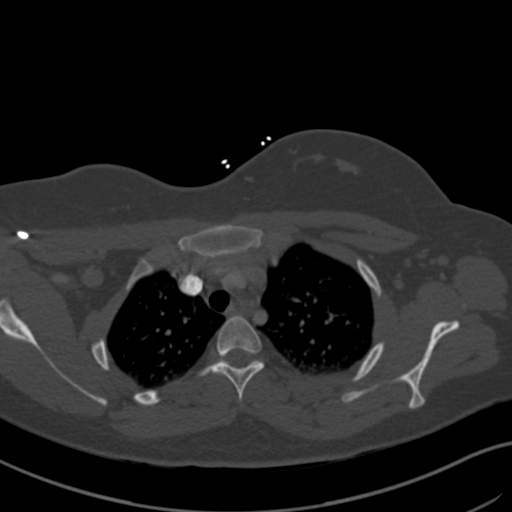

[Series 20: sagittal mpr · sagittal · 0.54mm/px · 3 of 167 slices shown]
[im 42/167  bone]
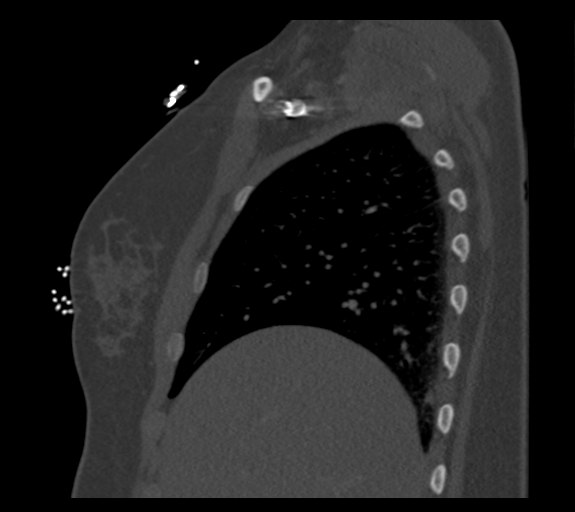
[im 84/167  bone]
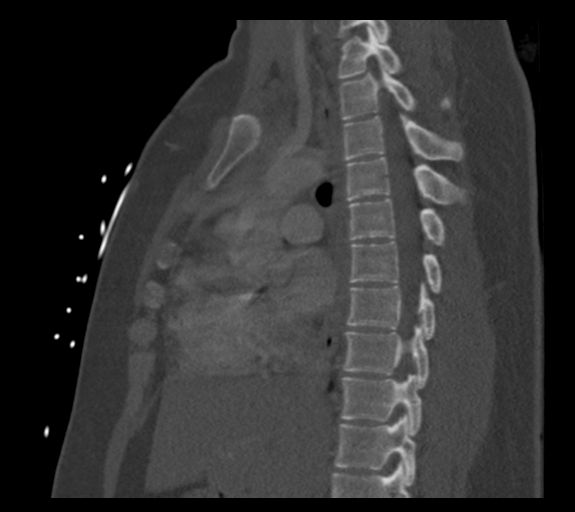
[im 125/167  bone]
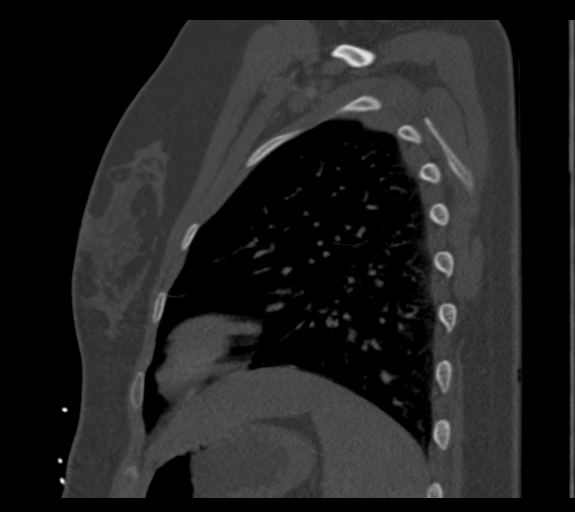

[6 of 33 positions shown; findings below may reference images not displayed]

FINDINGS: Pharynx and larynx: Symmetric pharyngeal soft tissues with at most
mild prominence of the palatine tonsils. Mild motion artifact
through the lower pharynx and larynx. Closed glottis. Patent airway.
No fluid collection or significant inflammatory changes in the
parapharyngeal or retropharyngeal spaces.

Salivary glands: No inflammation, mass, or stone.

Thyroid: 17 mm left thyroid nodule.

Lymph nodes: Level IIa lymph nodes measure 10 mm in short axis
bilaterally, upper limits of normal and likely benign/ reactive. No
frankly enlarged or suspicious lymph nodes in the neck.

Vascular: Major vascular structures of the neck are patent.

Limited intracranial: Unremarkable.

Visualized orbits: Unremarkable.

Mastoids and visualized paranasal sinuses: Clear.

Skeleton: No acute osseous abnormality or suspicious osseous lesion.

Upper chest: Reported separately.

Other: None.
IMPRESSION: 1. No acute abnormality identified in the neck.
2. Left thyroid nodule. Recommend further evaluation with
nonemergent ultrasound.

## 2018-04-08 IMAGING — CR DG CHEST 2V
2 series · 2 of 2 positions shown · non-contrast
Comparison: 02/19/2017 and prior radiographs

CLINICAL DATA: Status post thyroidectomy.

EXAM:
CHEST - 2 VIEW

[w chest pa]
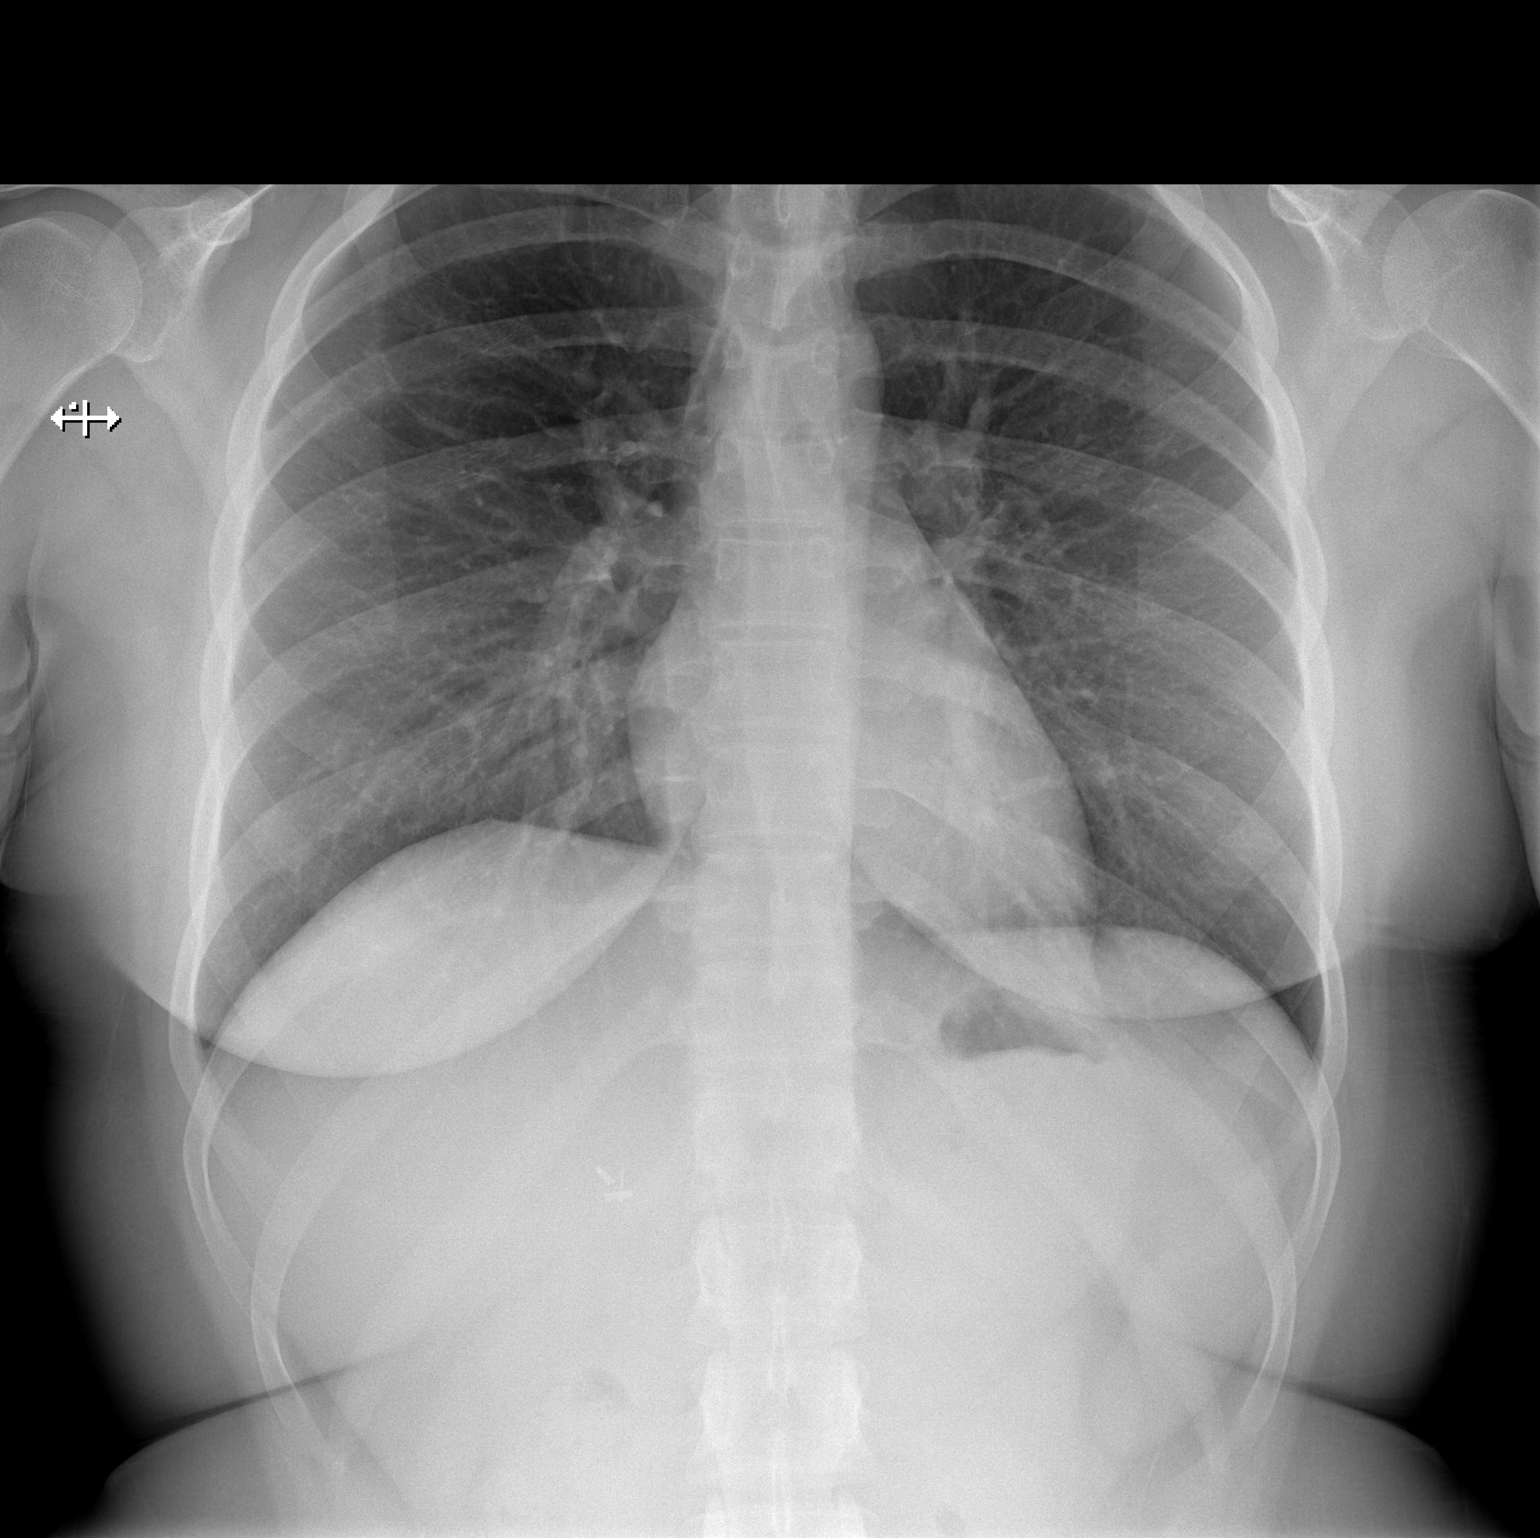

[w chest lat]
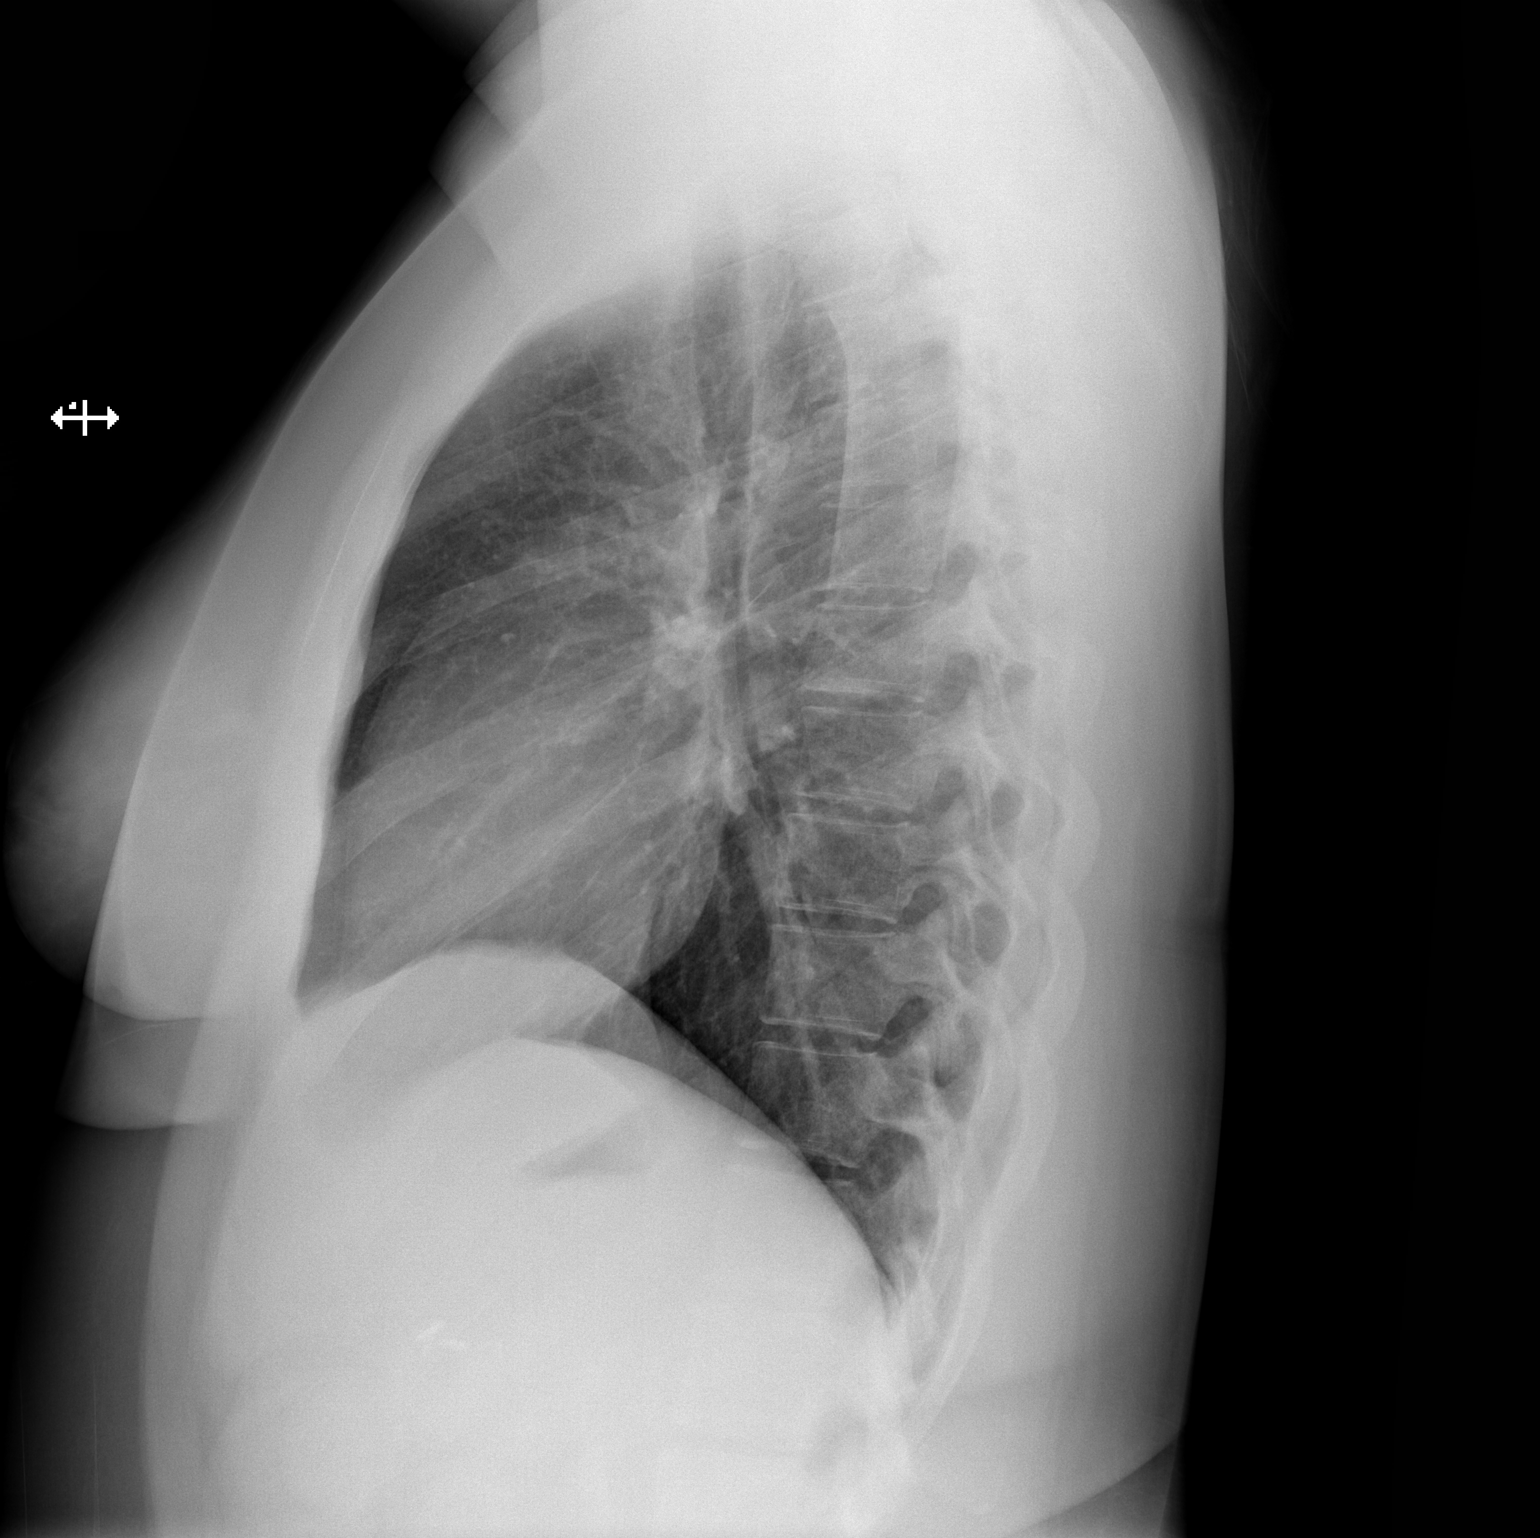

[2 of 2 positions shown; findings below may reference images not displayed]

FINDINGS: The cardiomediastinal silhouette is unremarkable.

There is no evidence of focal airspace disease, pulmonary edema,
suspicious pulmonary nodule/mass, pleural effusion, or pneumothorax.

No acute bony abnormalities are identified.
IMPRESSION: No active cardiopulmonary disease.

## 2018-05-16 NOTE — Progress Notes (Signed)
Patient is here for follow up visit.  Subjective:   _0  ID: Karen Chandler, female    DOB: Jan 27, 1996, 23 y.o.   MRN: [id number]  Chief Complaint  Patient presents with  . Tachycardia    6 month F/U    HPI  23 year old Caucasian female, h/o anxiety and depression, papillary thyroid carcinoma s/p total thyroidectomy in 06/26/17, seen for palpitations.  Her cardiac workup was reassuring. She was felt to have tachycardia, related to anxiety as well as inappripriate sinus tachcyardai. I started her on propranolol 10 mg tid.  Patient is here for 6 month follow up.  She is able to continue with her regular daily activities as well as exercise without any chest pain or shortness of breath.  She notices that her heart rate goes up to 170-180 during exercise, with occasional spikes up to 220.  She feels tired and fatigued with this, but does not have any presyncopal or syncopal symptoms.  She only takes propranolol as needed.   Past Medical History:  Diagnosis Date  . Abdominal pain, epigastric   . Anxiety   . Biliary colic   . Depression   . 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 Gastritis   . Inspiratory stridor 02/22/2017  . Seasonal allergies   . Wears contact lenses      Past Surgical History:  Procedure Laterality Date  . APPENDECTOMY  02/22/2013   Dr. Burt Knack  . CHOLECYSTECTOMY N/A 04/25/2015   Procedure: LAPAROSCOPIC CHOLECYSTECTOMY ;  Surgeon: Florene Glen, MD;  Location: [facility];  Service: General;  Laterality: N/A;  laparoscopic cholecystectomy  . ESOPHAGOGASTRODUODENOSCOPY (EGD) WITH PROPOFOL N/A 04/18/2015   Procedure: ESOPHAGOGASTRODUODENOSCOPY (EGD) WITH PROPOFOL;  Surgeon: Lucilla Lame, MD;  Location: [place];  Service: Endoscopy;  Laterality: N/A;  . THYROIDECTOMY Bilateral 06/11/2017   Procedure: THYROIDECTOMY;  Surgeon: Melida Quitter, MD;  Location: [place];  Service: ENT;  Laterality: Bilateral;  . WISDOM TOOTH EXTRACTION       Social  History   Socioeconomic History  . Marital status: Single    Spouse name: Not on file  . Number of children: Not on file  . Years of education: Not on file  . Highest education level: Not on file  Occupational History  . Not on file  Social Needs  . Financial resource strain: Not on file  . Food insecurity:    Worry: Not on file    Inability: Not on file  . Transportation needs:    Medical: Not on file    Non-medical: Not on file  Tobacco Use  . Smoking status: Never Smoker  . Smokeless tobacco: Never Used  Substance and Sexual Activity  . Alcohol use: Yes    Comment: occasionally  . Drug use: No  . Sexual activity: Not on file  Lifestyle  . Physical activity:    Days per week: Not on file    Minutes per session: Not on file  . Stress: Not on file  Relationships  . Social connections:    Talks on phone: Not on file    Gets together: Not on file    Attends religious service: Not on file    Active member of club or organization: Not on file    Attends meetings of clubs or organizations: Not on file    Relationship status: Not on file  . Intimate partner violence:    Fear of current or ex partner: Not on file    Emotionally abused: Not  on file    Physically abused: Not on file    Forced sexual activity: Not on file  Other Topics Concern  . Not on file  Social History Narrative  . Not on file     Current Outpatient Medications on File Prior to Visit  Medication Sig Dispense Refill  . BD PEN NEEDLE NANO U/F 32G X 4 MM MISC 1 Dose.    . calcium-vitamin D (OSCAL 500/200 D-3) 500-200 MG-UNIT tablet Take 1 tablet by mouth 2 (two) times daily. 180 tablet 3  . cetirizine (ZYRTEC) 10 MG tablet Take 10 mg by mouth daily.    Marland Kitchen ibuprofen (ADVIL,MOTRIN) 200 MG tablet Take 400 mg by mouth every 8 (eight) hours as needed (for pain/headaches.).    Marland Kitchen levothyroxine (SYNTHROID, LEVOTHROID) 125 MCG tablet Take 1 tablet by mouth daily.    . Norgestimate-Ethinyl Estradiol Triphasic  0.18/0.215/0.25 MG-35 MCG tablet Take 1 tablet by mouth.    . propranolol (INDERAL) 10 MG tablet Take 1 tablet by mouth 3 (three) times daily as needed.    Marland Kitchen SAXENDA 18 MG/3ML SOPN Inject 1 Dose as directed daily.    Marland Kitchen HYDROcodone-acetaminophen (NORCO/VICODIN) 5-325 MG tablet Take 1-2 tablets by mouth every 6 (six) hours as needed for moderate pain. (Patient not taking: Reported on 05/18/2018) 30 tablet 0  . hydrOXYzine (ATARAX/VISTARIL) 25 MG tablet Take 25 mg by mouth 2 (two) times daily as needed (FOR ANXIETY.).      No current facility-administered medications on file prior to visit.     Cardiovascular studies:  EKG 05/18/2018: Sinus rhythm 73 bpm Normal EKG.   Event monitor 12/01/2017 - 11/13/2017: Sinus rhythm/tachycardia up to 167 bpm. Symptoms of lightheadedness, shortness of breath, rapid heart beat correlate with sinus tachycardia. No other arrhtymia seen.  Echocardiogram 11/24/2017: Left ventricle cavity is normal in size. Normal global wall motion. Calculated EF 55%. Mild (Grade I) mitral regurgitation. Inadequate tricuspid regurgitation jet to estimate pulmonary artery pressure. Normal right atrial pressure.  EKG 10/27/2017: Sinus rhythm 77 bpm. Normal axis. Normal conduction. Normal EKG.  Recent labs:   06/03/2017: H/H 12/38. MCV 87. Platelets 302. GLucose 115. BUN/Cr 11/0.81. eGFR >60. Na/K 140/3.3  Review of Systems  Constitution: Negative for decreased appetite, malaise/fatigue, weight gain and weight loss.  HENT: Negative for congestion.   Eyes: Negative for visual disturbance.  Cardiovascular: Positive for dyspnea on exertion and palpitations (rapid heart rate). Negative for chest pain, leg swelling and syncope.  Respiratory: Negative for shortness of breath.   Endocrine: Negative for cold intolerance.  Hematologic/Lymphatic: Does not bruise/bleed easily.  Skin: Negative for itching and rash.  Musculoskeletal: Negative for myalgias.  Gastrointestinal:  Negative for abdominal pain, nausea and vomiting.  Genitourinary: Negative for dysuria.  Neurological: Negative for dizziness and weakness.  Psychiatric/Behavioral: The patient is not nervous/anxious.   All other systems reviewed and are negative.      Objective:    Vitals:   05/18/18 1336  BP: 120/90  Pulse: 81  SpO2: 98%     Physical Exam  Constitutional: She is oriented to person, place, and time. She appears well-developed and well-nourished. No distress.  HENT:  Head: Normocephalic and atraumatic.  Eyes: Pupils are equal, round, and reactive to light. Conjunctivae are normal.  Neck: No JVD present.  Cardiovascular: Normal rate, regular rhythm and intact distal pulses.  Pulmonary/Chest: Effort normal and breath sounds normal. She has no wheezes. She has no rales.  Abdominal: Soft. Bowel sounds are normal. There is no rebound.  Musculoskeletal:        General: No edema.  Lymphadenopathy:    She has no cervical adenopathy.  Neurological: She is alert and oriented to person, place, and time. No cranial nerve deficit.  Skin: Skin is warm and dry.  Psychiatric: She has a normal mood and affect.  Nursing note and vitals reviewed.       Assessment & Recommendations:   23 year old Caucasian female, h/o anxiety and depression, papillary thyroid carcinoma s/p total thyroidectomy in 06/26/17, was seen for palpitations. Unremarkable echocardiogram, sinus tachycardia on event monitor.  Palpitations: Hear rates of around 200 during exercise are not necessarily abnormal for her age. Encouraged her to hydrate herself well. May use propranolol as needed. If she gets more symptomatic with these, will then place event monitor again.  H/o thyroidectomy: Follow up TSH values with Dr. Buddy Duty   There are no diagnoses linked to this encounter.   Nigel Mormon, MD [facility] Pager: [phone] Office: [phone] If no answer Cell [phone]

## 2018-05-18 ENCOUNTER — Ambulatory Visit: Payer: BC Managed Care – PPO | Admitting: Cardiology

## 2018-05-18 ENCOUNTER — Other Ambulatory Visit: Payer: Self-pay

## 2018-05-18 ENCOUNTER — Encounter: Payer: Self-pay | Admitting: Cardiology

## 2018-05-18 VITALS — BP 120/90 | HR 81 | Ht 66.0 in | Wt 214.0 lb

## 2018-05-18 DIAGNOSIS — R002 Palpitations: Secondary | ICD-10-CM | POA: Diagnosis not present

## 2018-05-18 DIAGNOSIS — R000 Tachycardia, unspecified: Secondary | ICD-10-CM

## 2018-11-17 ENCOUNTER — Ambulatory Visit: Payer: BC Managed Care – PPO | Admitting: Cardiology

## 2018-11-24 ENCOUNTER — Encounter: Payer: Self-pay | Admitting: Cardiology

## 2018-11-24 ENCOUNTER — Ambulatory Visit: Payer: BC Managed Care – PPO | Admitting: Cardiology

## 2018-11-24 ENCOUNTER — Other Ambulatory Visit: Payer: Self-pay

## 2018-11-24 VITALS — BP 118/75 | HR 87 | Ht 66.0 in | Wt 188.6 lb

## 2018-11-24 DIAGNOSIS — R002 Palpitations: Secondary | ICD-10-CM | POA: Diagnosis not present

## 2018-11-24 NOTE — Progress Notes (Signed)
Patient is here for follow up visit.  Subjective:   @Patient  ID: Karen Chandler, female    DOB: [birth year], 23 y.o.   MRN: [id number]  Chief Complaint  Patient presents with  . Tachycardia  . Follow-up    HPI  23 year old Caucasian female, h/o anxiety and depression, papillary thyroid carcinoma s/p total thyroidectomy in 06/26/17, seen for palpitations.  Palpitations have improved. No syncopal episodes. She is trying to exercise more. Does not have any exertional chest pain.   Past Medical History:  Diagnosis Date  . Abdominal pain, epigastric   . Anxiety   . Biliary colic   . Depression   . 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 Gastritis   . Inspiratory stridor 02/22/2017  . Seasonal allergies   . Wears contact lenses      Past Surgical History:  Procedure Laterality Date  . APPENDECTOMY  02/22/2013   Dr. Burt Knack  . CHOLECYSTECTOMY N/A 04/25/2015   Procedure: LAPAROSCOPIC CHOLECYSTECTOMY ;  Surgeon: Florene Glen, MD;  Location: [facility];  Service: General;  Laterality: N/A;  laparoscopic cholecystectomy  . ESOPHAGOGASTRODUODENOSCOPY (EGD) WITH PROPOFOL N/A 04/18/2015   Procedure: ESOPHAGOGASTRODUODENOSCOPY (EGD) WITH PROPOFOL;  Surgeon: Lucilla Lame, MD;  Location: [place];  Service: Endoscopy;  Laterality: N/A;  . THYROIDECTOMY Bilateral 06/11/2017   Procedure: THYROIDECTOMY;  Surgeon: Melida Quitter, MD;  Location: [place];  Service: ENT;  Laterality: Bilateral;  . WISDOM TOOTH EXTRACTION       Social History   Socioeconomic History  . Marital status: Single    Spouse name: Not on file  . Number of children: Not on file  . Years of education: Not on file  . Highest education level: Not on file  Occupational History  . Not on file  Social Needs  . Financial resource strain: Not on file  . Food insecurity    Worry: Not on file    Inability: Not on file  . Transportation needs    Medical: Not on file    Non-medical: Not on file  Tobacco  Use  . Smoking status: Never Smoker  . Smokeless tobacco: Never Used  Substance and Sexual Activity  . Alcohol use: Yes    Comment: occasionally  . Drug use: No  . Sexual activity: Not on file  Lifestyle  . Physical activity    Days per week: Not on file    Minutes per session: Not on file  . Stress: Not on file  Relationships  . Social Herbalist on phone: Not on file    Gets together: Not on file    Attends religious service: Not on file    Active member of club or organization: Not on file    Attends meetings of clubs or organizations: Not on file    Relationship status: Not on file  . Intimate partner violence    Fear of current or ex partner: Not on file    Emotionally abused: Not on file    Physically abused: Not on file    Forced sexual activity: Not on file  Other Topics Concern  . Not on file  Social History Narrative  . Not on file     Current Outpatient Medications on File Prior to Visit  Medication Sig Dispense Refill  . BD PEN NEEDLE NANO U/F 32G X 4 MM MISC 1 Dose.    . calcium-vitamin D (OSCAL 500/200 D-3) 500-200 MG-UNIT tablet Take 1 tablet by mouth 2 (two)  times daily. 180 tablet 3  . cetirizine (ZYRTEC) 10 MG tablet Take 10 mg by mouth daily.    . hydrOXYzine (ATARAX/VISTARIL) 25 MG tablet Take 25 mg by mouth 2 (two) times daily as needed (FOR ANXIETY.).     Marland Kitchen ibuprofen (ADVIL,MOTRIN) 200 MG tablet Take 400 mg by mouth every 8 (eight) hours as needed (for pain/headaches.).    Marland Kitchen levothyroxine (SYNTHROID, LEVOTHROID) 125 MCG tablet Take 1 tablet by mouth daily.    . Norgestimate-Ethinyl Estradiol Triphasic 0.18/0.215/0.25 MG-35 MCG tablet Take 1 tablet by mouth.    . propranolol (INDERAL) 10 MG tablet Take 1 tablet by mouth 3 (three) times daily as needed.    Marland Kitchen SAXENDA 18 MG/3ML SOPN Inject 1 Dose as directed daily.     No current facility-administered medications on file prior to visit.     Cardiovascular studies:  EKG 11/24/2018: Sinus  rhythm 95 bpm Nonspecific T-abnormality  EKG 05/18/2018: Sinus rhythm 73 bpm Normal EKG.   Event monitor 12/01/2017 - 11/13/2017: Sinus rhythm/tachycardia up to 167 bpm. Symptoms of lightheadedness, shortness of breath, rapid heart beat correlate with sinus tachycardia. No other arrhtymia seen.  Echocardiogram 11/24/2017: Left ventricle cavity is normal in size. Normal global wall motion. Calculated EF 55%. Mild (Grade I) mitral regurgitation. Inadequate tricuspid regurgitation jet to estimate pulmonary artery pressure. Normal right atrial pressure.  EKG 10/27/2017: Sinus rhythm 77 bpm. Normal axis. Normal conduction. Normal EKG.  Recent labs:   06/03/2017: H/H 12/38. MCV 87. Platelets 302. GLucose 115. BUN/Cr 11/0.81. eGFR >60. Na/K 140/3.3  Review of Systems  Constitution: Negative for decreased appetite, malaise/fatigue, weight gain and weight loss.  HENT: Negative for congestion.   Eyes: Negative for visual disturbance.  Cardiovascular: Positive for dyspnea on exertion and palpitations (rapid heart rate). Negative for chest pain, leg swelling and syncope.  Respiratory: Negative for shortness of breath.   Endocrine: Negative for cold intolerance.  Hematologic/Lymphatic: Does not bruise/bleed easily.  Skin: Negative for itching and rash.  Musculoskeletal: Negative for myalgias.  Gastrointestinal: Negative for abdominal pain, nausea and vomiting.  Genitourinary: Negative for dysuria.  Neurological: Negative for dizziness and weakness.  Psychiatric/Behavioral: The patient is not nervous/anxious.   All other systems reviewed and are negative.      Objective:    Vitals:   11/24/18 1556  BP: 118/75  Pulse: 87  SpO2: 97%     Physical Exam  Constitutional: She is oriented to person, place, and time. She appears well-developed and well-nourished. No distress.  HENT:  Head: Normocephalic and atraumatic.  Eyes: Pupils are equal, round, and reactive to light.  Conjunctivae are normal.  Neck: No JVD present.  Cardiovascular: Normal rate, regular rhythm and intact distal pulses.  Pulmonary/Chest: Effort normal and breath sounds normal. She has no wheezes. She has no rales.  Abdominal: Soft. Bowel sounds are normal. There is no rebound.  Musculoskeletal:        General: No edema.  Lymphadenopathy:    She has no cervical adenopathy.  Neurological: She is alert and oriented to person, place, and time. No cranial nerve deficit.  Skin: Skin is warm and dry.  Psychiatric: She has a normal mood and affect.  Nursing note and vitals reviewed.       Assessment & Recommendations:   23 year old Caucasian female, h/o anxiety and depression, papillary thyroid carcinoma s/p total thyroidectomy in 06/26/17, was seen for palpitations. Unremarkable echocardiogram, sinus tachycardia on event monitor.  Palpitations: Improved. Normal exam and EKG. Encouraged liberal hydration. Recommend watchful  monitoring.  H/o thyroidectomy: Follow up TSH values with Dr. Buddy Duty  F/u in 1 year  Nigel Mormon, MD [facility] [place] Pager: [phone] Office: [phone] If no answer Cell [phone]

## 2019-05-27 IMAGING — US US THYROID
1 series · 13 of 25 positions shown · non-contrast
Comparison: 02/19/2017

CLINICAL DATA: Left thyroid nodule by CT

EXAM:
THYROID ULTRASOUND
TECHNIQUE: Ultrasound examination of the thyroid gland and adjacent soft
tissues was performed.

[Series 1: us thyroid · 0.05mm/px · 13 of 49 slices shown]
[im 1/49]
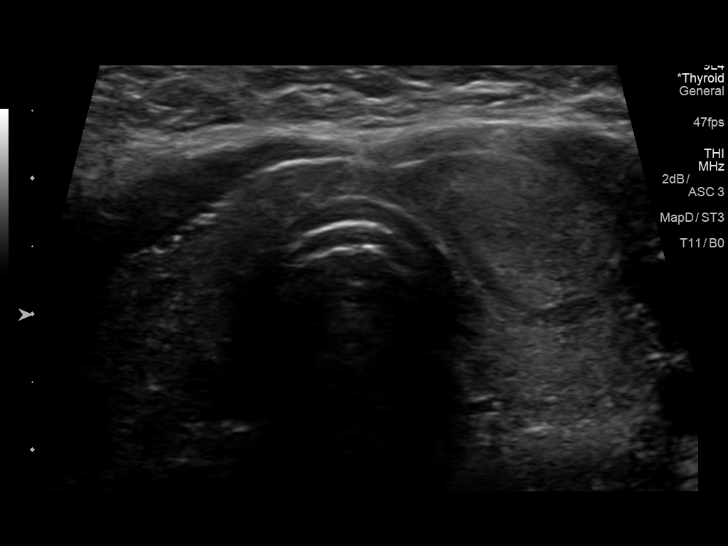
[im 5/49]
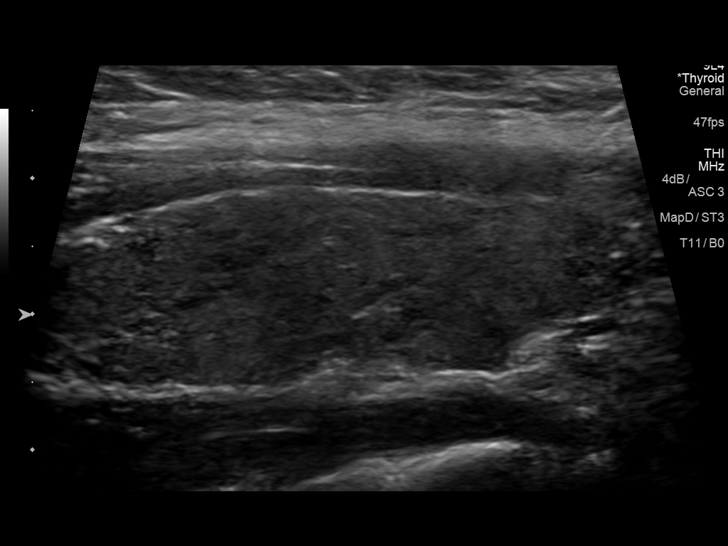
[im 9/49]
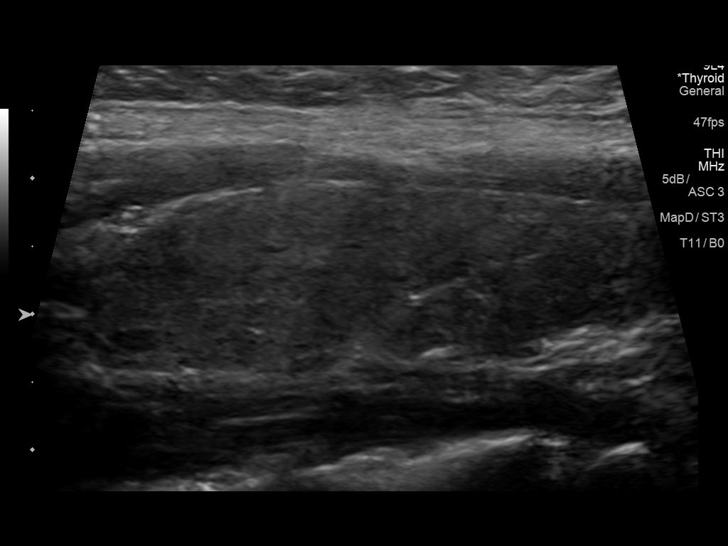
[im 13/49]
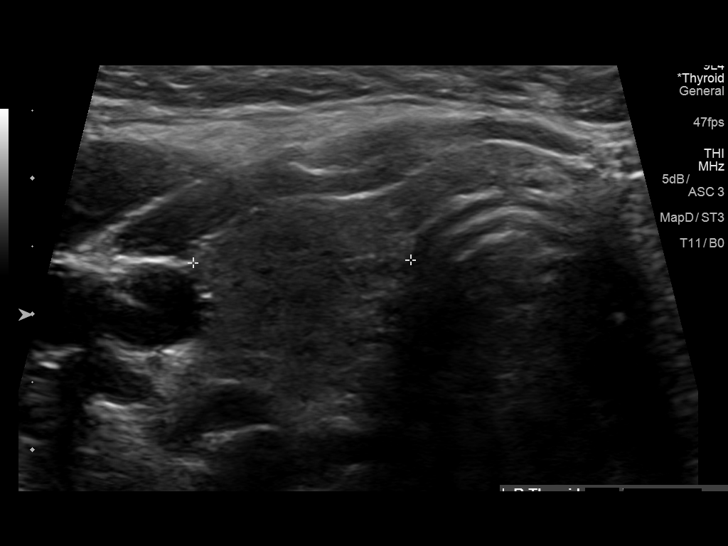
[im 17/49]
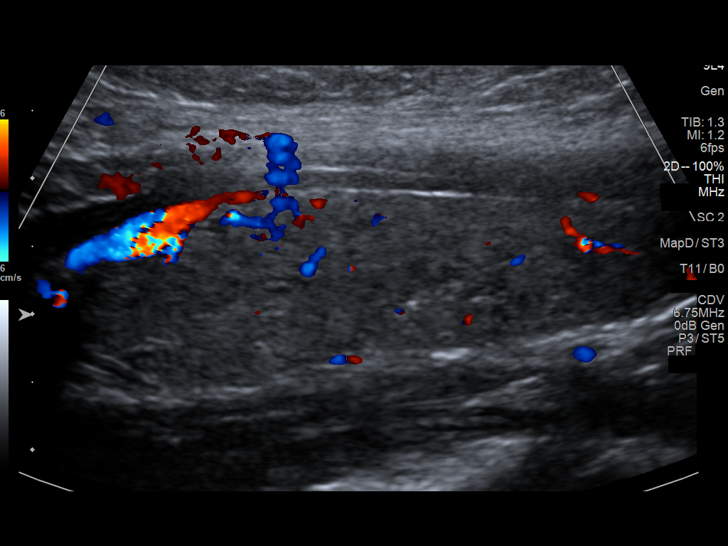
[im 21/49]
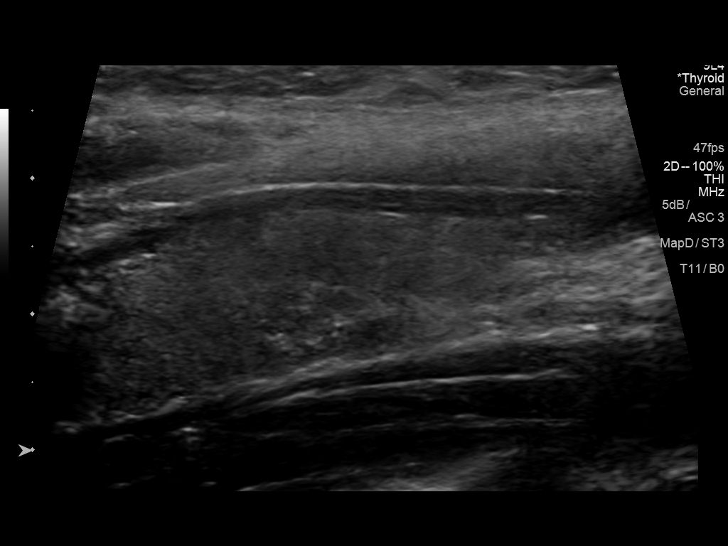
[im 25/49]
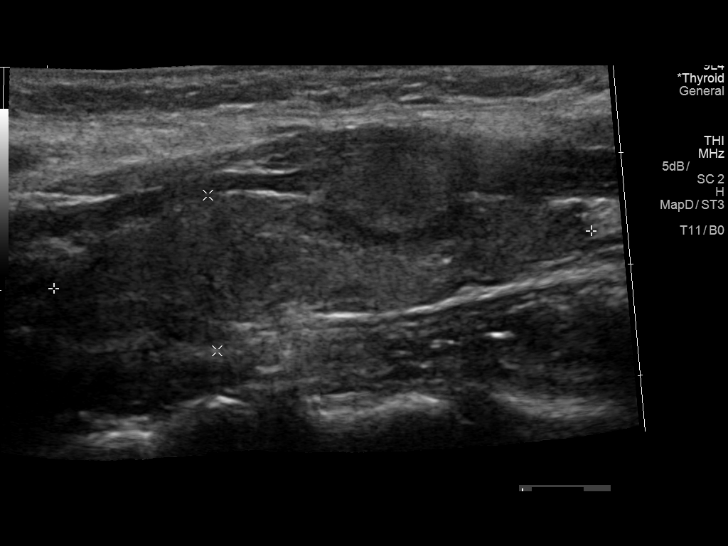
[im 29/49]
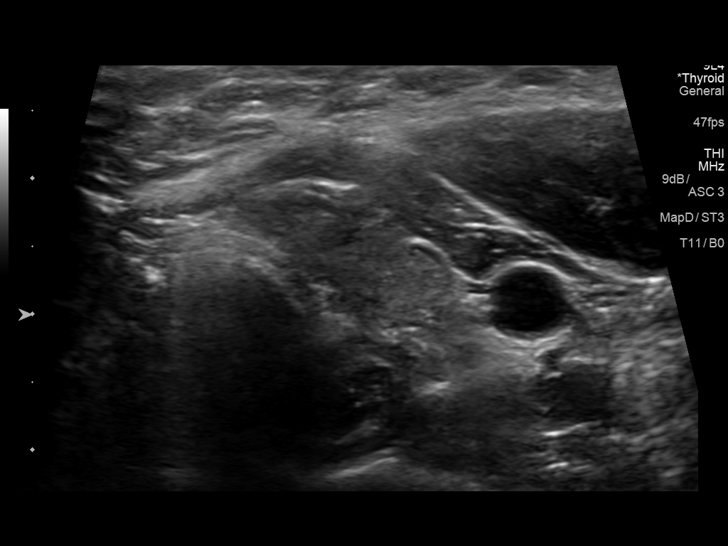
[im 33/49]
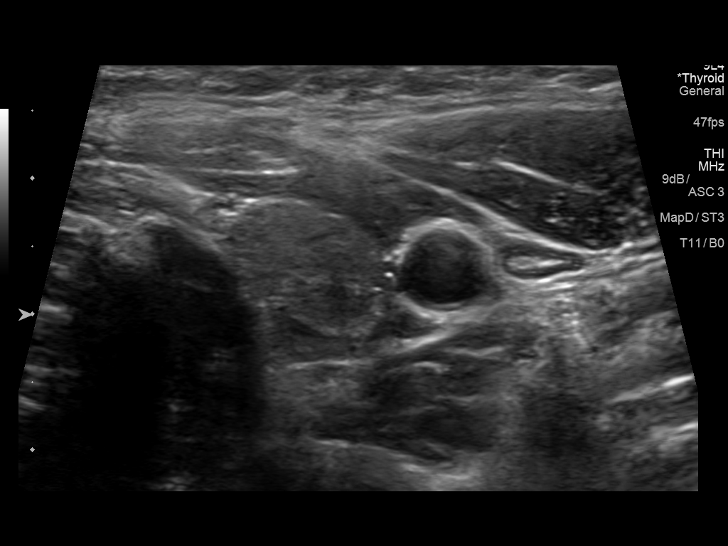
[im 37/49]
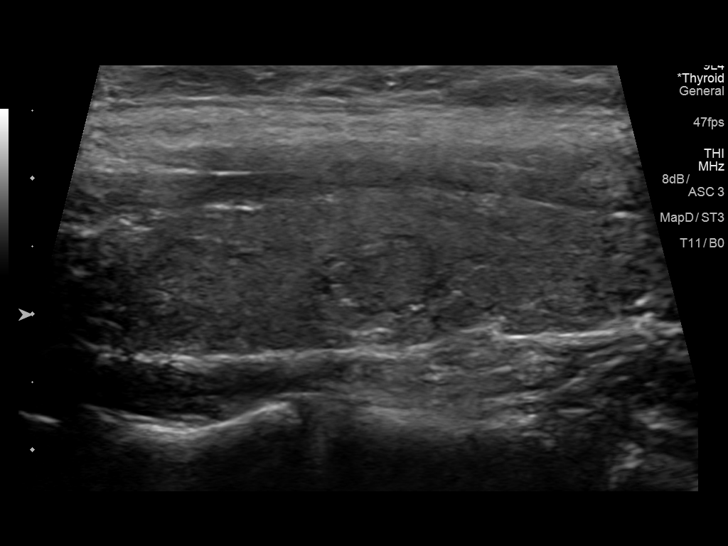
[im 41/49]
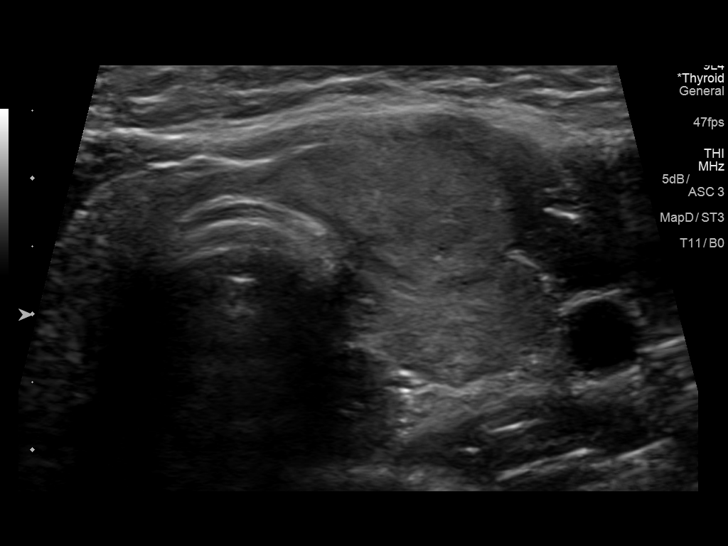
[im 45/49]
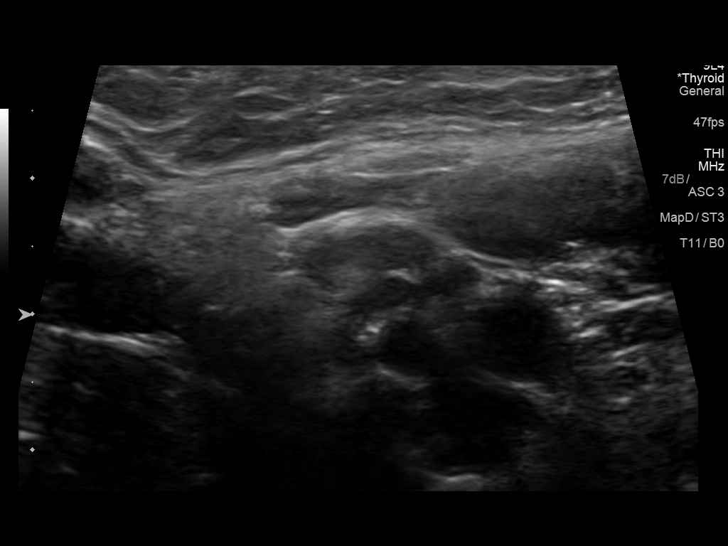
[im 49/49]
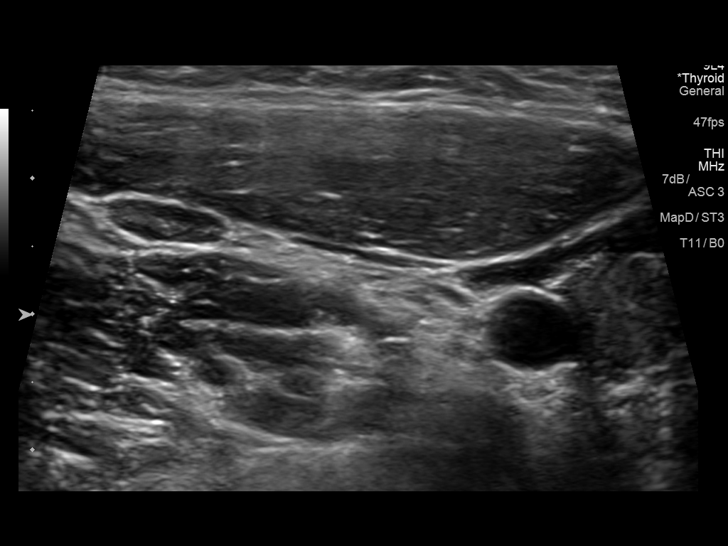

[13 of 25 positions shown; findings below may reference images not displayed]

FINDINGS: Parenchymal Echotexture: Mildly heterogenous

Isthmus: 3 mm

Right lobe: There is 4.7 x 1.5 x 1.6 cm

Left lobe: 4.8 x 1.4 x 1.6 cm

_________________________________________________________

Estimated total number of nodules >/= 1 cm: 1

Number of spongiform nodules >/=  2 cm not described below (TR1): 0

Number of mixed cystic and solid nodules >/= 1.5 cm not described
below (TR2): 0

_________________________________________________________

Nodule # 1:

Location: Left; Mid

Maximum size: 0.9 Cm; Other 2 dimensions: 0.7 x 0.4 cm

Composition: solid/almost completely solid (2)

Echogenicity: isoechoic (1)

Shape: not taller-than-wide (0)

Margins: ill-defined (0)

Echogenic foci: peripheral calcifications (2)

ACR TI-RADS total points: 5.

ACR TI-RADS risk category: TR4 (4-6 points).

ACR TI-RADS recommendations:

Given size (<0.9 cm) and appearance, this nodule does NOT meet
TI-RADS criteria for biopsy or dedicated follow-up.

_________________________________________________________

Nodule # 2:

Location: Left; Inferior

Maximum size: 2.7 cm; Other 2 dimensions: 1.8 x 1.2 cm

Composition: solid/almost completely solid (2)

Echogenicity: isoechoic (1)

Shape: not taller-than-wide (0)

Margins: smooth (0)

Echogenic foci: none (0)

ACR TI-RADS total points: 3.

ACR TI-RADS risk category: TR3 (3 points).

ACR TI-RADS recommendations:

**Given size (>/= 2.5 cm) and appearance, fine needle aspiration of
this mildly suspicious nodule should be considered based on TI-RADS
criteria.

_________________________________________________________
IMPRESSION: 2.7 cm left inferior TR 3 nodule correlates with the CT finding and
meets criteria for biopsy as above.

The above is in keeping with the ACR TI-RADS recommendations - [HOSPITAL] 0063;[DATE].

## 2019-06-08 ENCOUNTER — Other Ambulatory Visit (HOSPITAL_COMMUNITY)
Admission: RE | Admit: 2019-06-08 | Discharge: 2019-06-08 | Disposition: A | Discharge status: Home / Self Care | Payer: BC Managed Care – PPO | Source: Ambulatory Visit | Attending: Family Medicine | Admitting: Family Medicine

## 2019-06-08 ENCOUNTER — Other Ambulatory Visit: Payer: Self-pay | Admitting: Family Medicine

## 2019-06-08 DIAGNOSIS — Z124 Encounter for screening for malignant neoplasm of cervix: Secondary | ICD-10-CM | POA: Insufficient documentation

## 2019-06-16 LAB — CYTOLOGY - PAP
Comment: NEGATIVE
High risk HPV: POSITIVE — AB

## 2019-06-23 ENCOUNTER — Other Ambulatory Visit: Payer: Self-pay | Admitting: Obstetrics and Gynecology

## 2019-11-28 NOTE — Progress Notes (Signed)
No show

## 2019-11-29 ENCOUNTER — Ambulatory Visit: Payer: BC Managed Care – PPO | Admitting: Cardiology

## 2021-12-23 ENCOUNTER — Other Ambulatory Visit: Payer: Self-pay | Admitting: Obstetrics and Gynecology

## 2022-02-10 ENCOUNTER — Encounter (HOSPITAL_BASED_OUTPATIENT_CLINIC_OR_DEPARTMENT_OTHER): Payer: Self-pay | Admitting: Obstetrics and Gynecology

## 2022-02-10 NOTE — Progress Notes (Signed)
Spoke w/ via phone for pre-op interview--- Karen Chandler needs dos----  UPT             Chandler results------ COVID test -----patient states asymptomatic no test needed Arrive at -------1200 NPO after MN NO Solid Food.  Clear liquids from MN until---1100 Med rec completed Medications to take morning of surgery -----Synthroid Diabetic medication ----- Patient instructed no nail polish to be worn day of surgery Patient instructed to bring photo id and insurance card day of surgery Patient aware to have Driver (ride ) / caregiver  father Karen Chandler  for 24 hours after surgery  Patient Special Instructions ----- Pre-Op special Istructions ----- Patient verbalized understanding of instructions that were given at this phone interview. Patient denies shortness of breath, chest pain, fever, cough at this phone interview.

## 2022-02-13 ENCOUNTER — Ambulatory Visit (HOSPITAL_BASED_OUTPATIENT_CLINIC_OR_DEPARTMENT_OTHER)
Admission: RE | Admit: 2022-02-13 | Discharge: 2022-02-13 | Disposition: A | Discharge status: Home / Self Care | Payer: BC Managed Care – PPO | Attending: Obstetrics and Gynecology | Admitting: Obstetrics and Gynecology

## 2022-02-13 ENCOUNTER — Ambulatory Visit (HOSPITAL_BASED_OUTPATIENT_CLINIC_OR_DEPARTMENT_OTHER): Payer: BC Managed Care – PPO | Admitting: Anesthesiology

## 2022-02-13 ENCOUNTER — Encounter (HOSPITAL_BASED_OUTPATIENT_CLINIC_OR_DEPARTMENT_OTHER): Payer: Self-pay | Admitting: Obstetrics and Gynecology

## 2022-02-13 ENCOUNTER — Encounter (HOSPITAL_BASED_OUTPATIENT_CLINIC_OR_DEPARTMENT_OTHER)
Admission: RE | Disposition: A | Discharge status: Home / Self Care | Payer: Self-pay | Source: Home / Self Care | Attending: Obstetrics and Gynecology

## 2022-02-13 ENCOUNTER — Other Ambulatory Visit: Payer: Self-pay

## 2022-02-13 DIAGNOSIS — E890 Postprocedural hypothyroidism: Secondary | ICD-10-CM | POA: Insufficient documentation

## 2022-02-13 DIAGNOSIS — N803 Endometriosis of pelvic peritoneum, unspecified: Secondary | ICD-10-CM | POA: Insufficient documentation

## 2022-02-13 DIAGNOSIS — N736 Female pelvic peritoneal adhesions (postinfective): Secondary | ICD-10-CM | POA: Diagnosis not present

## 2022-02-13 DIAGNOSIS — R102 Pelvic and perineal pain: Secondary | ICD-10-CM | POA: Diagnosis present

## 2022-02-13 DIAGNOSIS — Z01818 Encounter for other preprocedural examination: Secondary | ICD-10-CM

## 2022-02-13 HISTORY — PX: LAPAROSCOPY: SHX197

## 2022-02-13 HISTORY — DX: Hypothyroidism, unspecified: E03.9

## 2022-02-13 LAB — CBC
HCT: 46.5 % — ABNORMAL HIGH (ref 36.0–46.0)
Hemoglobin: 15.6 g/dL — ABNORMAL HIGH (ref 12.0–15.0)
MCH: 29.2 pg (ref 26.0–34.0)
MCHC: 33.5 g/dL (ref 30.0–36.0)
MCV: 87.1 fL (ref 80.0–100.0)
Platelets: 306 10*3/uL (ref 150–400)
RBC: 5.34 MIL/uL — ABNORMAL HIGH (ref 3.87–5.11)
RDW: 12.8 % (ref 11.5–15.5)
WBC: 9 10*3/uL (ref 4.0–10.5)
nRBC: 0 % (ref 0.0–0.2)

## 2022-02-13 LAB — BASIC METABOLIC PANEL
Anion gap: 10 (ref 5–15)
BUN: 14 mg/dL (ref 6–20)
CO2: 22 mmol/L (ref 22–32)
Calcium: 9.6 mg/dL (ref 8.9–10.3)
Chloride: 108 mmol/L (ref 98–111)
Creatinine, Ser: 0.87 mg/dL (ref 0.44–1.00)
GFR, Estimated: >60 mL/min (ref >60)
Glucose, Bld: 80 mg/dL (ref 70–99)
Potassium: 3.9 mmol/L (ref 3.5–5.1)
Sodium: 140 mmol/L (ref 135–145)

## 2022-02-13 LAB — POCT PREGNANCY, URINE: Preg Test, Ur: NEGATIVE

## 2022-02-13 SURGERY — LAPAROSCOPY, DIAGNOSTIC
Anesthesia: General | Site: Pelvis

## 2022-02-13 MED ORDER — LIDOCAINE HCL (PF) 2 % IJ SOLN
INTRAMUSCULAR | Status: AC
  Filled 2022-02-13: qty 5

## 2022-02-13 MED ORDER — DEXAMETHASONE SODIUM PHOSPHATE 4 MG/ML IJ SOLN
INTRAMUSCULAR | Status: DC | PRN
  Administered 2022-02-13: 5 mg via INTRAVENOUS

## 2022-02-13 MED ORDER — ONDANSETRON HCL 4 MG/2ML IJ SOLN
INTRAMUSCULAR | Status: DC | PRN
  Administered 2022-02-13: 4 mg via INTRAVENOUS

## 2022-02-13 MED ORDER — NORETHINDRONE ACETATE 5 MG PO TABS: 10.0000 mg | ORAL_TABLET | Freq: Every day | ORAL | 0 refills | Status: AC

## 2022-02-13 MED ORDER — OXYCODONE HCL 5 MG PO TABS
ORAL_TABLET | ORAL | Status: AC
  Filled 2022-02-13: qty 1

## 2022-02-13 MED ORDER — KETOROLAC TROMETHAMINE 30 MG/ML IJ SOLN
INTRAMUSCULAR | Status: DC | PRN
  Administered 2022-02-13: 30 mg via INTRAVENOUS

## 2022-02-13 MED ORDER — ACETAMINOPHEN 500 MG PO TABS
1000.0000 mg | ORAL_TABLET | Freq: Once | ORAL | Status: AC
  Administered 2022-02-13: 1000 mg via ORAL

## 2022-02-13 MED ORDER — ONDANSETRON HCL 4 MG/2ML IJ SOLN
INTRAMUSCULAR | Status: AC
  Filled 2022-02-13: qty 2

## 2022-02-13 MED ORDER — CEFAZOLIN SODIUM-DEXTROSE 2-4 GM/100ML-% IV SOLN
2.0000 g | INTRAVENOUS | Status: AC
  Administered 2022-02-13: 2 g via INTRAVENOUS

## 2022-02-13 MED ORDER — PROPOFOL 10 MG/ML IV BOLUS
INTRAVENOUS | Status: DC | PRN
  Administered 2022-02-13: 40 mg via INTRAVENOUS
  Administered 2022-02-13: 160 mg via INTRAVENOUS

## 2022-02-13 MED ORDER — FENTANYL CITRATE (PF) 100 MCG/2ML IJ SOLN
INTRAMUSCULAR | Status: DC | PRN
  Administered 2022-02-13 (×2): 50 ug via INTRAVENOUS

## 2022-02-13 MED ORDER — PHENYLEPHRINE 80 MCG/ML (10ML) SYRINGE FOR IV PUSH (FOR BLOOD PRESSURE SUPPORT)
PREFILLED_SYRINGE | INTRAVENOUS | Status: AC
  Filled 2022-02-13: qty 10

## 2022-02-13 MED ORDER — FENTANYL CITRATE (PF) 100 MCG/2ML IJ SOLN
25.0000 ug | INTRAMUSCULAR | Status: DC | PRN
  Administered 2022-02-13 (×2): 25 ug via INTRAVENOUS

## 2022-02-13 MED ORDER — ROCURONIUM BROMIDE 100 MG/10ML IV SOLN
INTRAVENOUS | Status: DC | PRN
  Administered 2022-02-13: 60 mg via INTRAVENOUS

## 2022-02-13 MED ORDER — HEMOSTATIC AGENTS (NO CHARGE) OPTIME
TOPICAL | Status: DC | PRN
  Administered 2022-02-13: 1

## 2022-02-13 MED ORDER — SCOPOLAMINE 1 MG/3DAYS TD PT72
MEDICATED_PATCH | TRANSDERMAL | Status: AC
  Filled 2022-02-13: qty 1

## 2022-02-13 MED ORDER — OXYCODONE HCL 5 MG PO TABS
5.0000 mg | ORAL_TABLET | Freq: Once | ORAL | Status: AC | PRN
  Administered 2022-02-13: 5 mg via ORAL

## 2022-02-13 MED ORDER — ROCURONIUM BROMIDE 10 MG/ML (PF) SYRINGE
PREFILLED_SYRINGE | INTRAVENOUS | Status: AC
  Filled 2022-02-13: qty 10

## 2022-02-13 MED ORDER — PROPOFOL 10 MG/ML IV BOLUS
INTRAVENOUS | Status: AC
  Filled 2022-02-13: qty 20

## 2022-02-13 MED ORDER — BUPIVACAINE HCL (PF) 0.25 % IJ SOLN
INTRAMUSCULAR | Status: DC | PRN
  Administered 2022-02-13: 12 mL

## 2022-02-13 MED ORDER — LIDOCAINE HCL (CARDIAC) PF 100 MG/5ML IV SOSY
PREFILLED_SYRINGE | INTRAVENOUS | Status: DC | PRN
  Administered 2022-02-13: 50 mg via INTRAVENOUS

## 2022-02-13 MED ORDER — OXYCODONE HCL 5 MG/5ML PO SOLN: 5.0000 mg | Freq: Once | ORAL | Status: AC | PRN

## 2022-02-13 MED ORDER — MIDAZOLAM HCL 5 MG/5ML IJ SOLN
INTRAMUSCULAR | Status: DC | PRN
  Administered 2022-02-13: 2 mg via INTRAVENOUS

## 2022-02-13 MED ORDER — METHYLENE BLUE 1 % INJ SOLN
INTRAVENOUS | Status: DC | PRN
  Administered 2022-02-13: 1 mL

## 2022-02-13 MED ORDER — IBUPROFEN 600 MG PO TABS: 600.0000 mg | ORAL_TABLET | Freq: Four times a day (QID) | ORAL | 1 refills | Status: AC | PRN

## 2022-02-13 MED ORDER — DEXMEDETOMIDINE HCL IN NACL 80 MCG/20ML IV SOLN
INTRAVENOUS | Status: DC | PRN
  Administered 2022-02-13: 12 ug via BUCCAL

## 2022-02-13 MED ORDER — DEXAMETHASONE SODIUM PHOSPHATE 10 MG/ML IJ SOLN
INTRAMUSCULAR | Status: AC
  Filled 2022-02-13: qty 1

## 2022-02-13 MED ORDER — FENTANYL CITRATE (PF) 100 MCG/2ML IJ SOLN
INTRAMUSCULAR | Status: AC
  Filled 2022-02-13: qty 2

## 2022-02-13 MED ORDER — PHENYLEPHRINE 80 MCG/ML (10ML) SYRINGE FOR IV PUSH (FOR BLOOD PRESSURE SUPPORT)
PREFILLED_SYRINGE | INTRAVENOUS | Status: DC | PRN
  Administered 2022-02-13 (×2): 80 ug via INTRAVENOUS

## 2022-02-13 MED ORDER — MIDAZOLAM HCL 2 MG/2ML IJ SOLN
INTRAMUSCULAR | Status: AC
  Filled 2022-02-13: qty 2

## 2022-02-13 MED ORDER — OXYCODONE HCL 5 MG PO TABS: 5.0000 mg | ORAL_TABLET | Freq: Four times a day (QID) | ORAL | 0 refills | Status: AC | PRN

## 2022-02-13 MED ORDER — AMISULPRIDE (ANTIEMETIC) 5 MG/2ML IV SOLN: 10.0000 mg | Freq: Once | INTRAVENOUS | Status: DC | PRN

## 2022-02-13 MED ORDER — SUGAMMADEX SODIUM 200 MG/2ML IV SOLN
INTRAVENOUS | Status: DC | PRN
  Administered 2022-02-13: 200 mg via INTRAVENOUS

## 2022-02-13 MED ORDER — SCOPOLAMINE 1 MG/3DAYS TD PT72
1.0000 | MEDICATED_PATCH | Freq: Once | TRANSDERMAL | Status: DC
  Administered 2022-02-13: 1.5 mg via TRANSDERMAL

## 2022-02-13 MED ORDER — POVIDONE-IODINE 10 % EX SWAB: 2.0000 | Freq: Once | CUTANEOUS | Status: DC

## 2022-02-13 MED ORDER — LACTATED RINGERS IV SOLN: INTRAVENOUS | Status: DC

## 2022-02-13 MED ORDER — KETOROLAC TROMETHAMINE 30 MG/ML IJ SOLN
INTRAMUSCULAR | Status: AC
  Filled 2022-02-13: qty 1

## 2022-02-13 MED ORDER — CEFAZOLIN SODIUM-DEXTROSE 2-4 GM/100ML-% IV SOLN
INTRAVENOUS | Status: AC
  Filled 2022-02-13: qty 100

## 2022-02-13 MED ORDER — ACETAMINOPHEN 500 MG PO TABS
ORAL_TABLET | ORAL | Status: AC
  Filled 2022-02-13: qty 2

## 2022-02-13 SURGICAL SUPPLY — 69 items
ADH SKN CLS APL DERMABOND .7 (GAUZE/BANDAGES/DRESSINGS) ×2
APL PRP STRL LF DISP 70% ISPRP (MISCELLANEOUS)
APL SRG 38 LTWT LNG FL B (MISCELLANEOUS) ×4
APPLICATOR ARISTA FLEXITIP XL (MISCELLANEOUS) ×2 IMPLANT
BARRIER ADHS 3X4 INTERCEED (GAUZE/BANDAGES/DRESSINGS) IMPLANT
BLADE SURG 10 STRL SS (BLADE) ×4 IMPLANT
BRR ADH 4X3 ABS CNTRL BYND (GAUZE/BANDAGES/DRESSINGS)
CABLE HIGH FREQUENCY MONO STRZ (ELECTRODE) IMPLANT
CHLORAPREP W/TINT 26 (MISCELLANEOUS) IMPLANT
COVER MAYO STAND STRL (DRAPES) ×2 IMPLANT
DERMABOND ADVANCED .7 DNX12 (GAUZE/BANDAGES/DRESSINGS) ×2 IMPLANT
DILATOR CANAL MILEX (MISCELLANEOUS) IMPLANT
DISSECTOR ROUND CHERRY 3/8 STR (MISCELLANEOUS) ×2 IMPLANT
DRAPE WARM FLUID 44X44 (DRAPES) IMPLANT
DRSG OPSITE POSTOP 3X4 (GAUZE/BANDAGES/DRESSINGS) ×2 IMPLANT
DRSG TELFA 3X8 NADH STRL (GAUZE/BANDAGES/DRESSINGS) ×1 IMPLANT
DURAPREP 26ML APPLICATOR (WOUND CARE) ×4 IMPLANT
GAUZE 4X4 16PLY ~~LOC~~+RFID DBL (SPONGE) ×2 IMPLANT
GLOVE BIO SURGEON STRL SZ7.5 (GLOVE) ×2 IMPLANT
GLOVE BIOGEL PI IND STRL 7.0 (GLOVE) ×4 IMPLANT
GLOVE BIOGEL PI IND STRL 7.5 (GLOVE) ×4 IMPLANT
GOWN STRL REUS W/TWL LRG LVL3 (GOWN DISPOSABLE) ×4 IMPLANT
HEMOSTAT ARISTA ABSORB 3G PWDR (HEMOSTASIS) ×2 IMPLANT
HEMOSTAT SURGICEL 4X8 (HEMOSTASIS) IMPLANT
HIBICLENS CHG 4% 4OZ BTL (MISCELLANEOUS) ×2 IMPLANT
KIT TURNOVER CYSTO (KITS) ×2 IMPLANT
LIGASURE VESSEL 5MM BLUNT TIP (ELECTROSURGICAL) ×2 IMPLANT
NDL INSUFFLATION 14GA 120MM (NEEDLE) ×1 IMPLANT
NEEDLE HYPO 22GX1.5 SAFETY (NEEDLE) IMPLANT
NEEDLE INSUFFLATION 14GA 120MM (NEEDLE) ×2 IMPLANT
NS IRRIG 1000ML POUR BTL (IV SOLUTION) ×2 IMPLANT
PACK ABDOMINAL GYN (CUSTOM PROCEDURE TRAY) ×2 IMPLANT
PACK LAPAROSCOPY BASIN (CUSTOM PROCEDURE TRAY) ×2 IMPLANT
PACK TRENDGUARD 450 HYBRID PRO (MISCELLANEOUS) ×2 IMPLANT
PAD OB MATERNITY 4.3X12.25 (PERSONAL CARE ITEMS) ×2 IMPLANT
PENCIL BUTTON HOLSTER BLD 10FT (ELECTRODE) ×2 IMPLANT
PROTECTOR NERVE ULNAR (MISCELLANEOUS) ×2 IMPLANT
SET SUCTION IRRIG HYDROSURG (IRRIGATION / IRRIGATOR) IMPLANT
SET TUBE SMOKE EVAC HIGH FLOW (TUBING) ×2 IMPLANT
SLEEVE Z-THREAD 5X100MM (TROCAR) ×1 IMPLANT
SPIKE FLUID TRANSFER (MISCELLANEOUS) IMPLANT
SPONGE T-LAP 18X18 ~~LOC~~+RFID (SPONGE) ×2 IMPLANT
STAPLER VISISTAT 35W (STAPLE) IMPLANT
SUT CHROMIC 2 0 CT 1 (SUTURE) ×2 IMPLANT
SUT CHROMIC 2 0 SH (SUTURE) IMPLANT
SUT MNCRL AB 3-0 PS2 27 (SUTURE) ×3 IMPLANT
SUT MON AB 3-0 SH 27 (SUTURE)
SUT MON AB 3-0 SH27 (SUTURE) IMPLANT
SUT PDS AB 1 CTX 36 (SUTURE) IMPLANT
SUT PLAIN 2 0 XLH (SUTURE) ×2 IMPLANT
SUT VIC AB 0 CT1 18XCR BRD8 (SUTURE) IMPLANT
SUT VIC AB 0 CT1 27 (SUTURE) ×8
SUT VIC AB 0 CT1 27XBRD ANBCTR (SUTURE) ×8 IMPLANT
SUT VIC AB 0 CT1 8-18 (SUTURE)
SUT VIC AB 4-0 PS2 18 (SUTURE) IMPLANT
SUT VICRYL 0 ENDOLOOP (SUTURE) IMPLANT
SUT VICRYL 0 TIES 12 18 (SUTURE) ×2 IMPLANT
SUT VICRYL 0 UR6 27IN ABS (SUTURE) ×3 IMPLANT
SYR 50ML LL SCALE MARK (SYRINGE) ×3 IMPLANT
SYR CONTROL 10ML LL (SYRINGE) IMPLANT
SYS BAG RETRIEVAL 10MM (BASKET)
SYSTEM BAG RETRIEVAL 10MM (BASKET) IMPLANT
TOWEL OR 17X26 10 PK STRL BLUE (TOWEL DISPOSABLE) ×4 IMPLANT
TRAY FOLEY W/BAG SLVR 14FR (SET/KITS/TRAYS/PACK) ×2 IMPLANT
TRAY FOLEY W/BAG SLVR 14FR LF (SET/KITS/TRAYS/PACK) ×2 IMPLANT
TRENDGUARD 450 HYBRID PRO PACK (MISCELLANEOUS) ×2
TROCAR Z-THREAD FIOS 11X100 BL (TROCAR) ×2 IMPLANT
TROCAR Z-THREAD FIOS 5X100MM (TROCAR) ×2 IMPLANT
WARMER LAPAROSCOPE (MISCELLANEOUS) ×2 IMPLANT

## 2022-02-13 NOTE — H&P (Signed)
Karen Chandler is an 26 y.o. female. Pt known to me with pelvic pain and a remote dx of possible endometriosis.    Pertinent Gynecological History: h/o regular cycles, q2wks sometimes, h/o suspected endometriosis dx'd at 26yo OB History: G0  Menstrual History:  Patient's last menstrual period was 01/28/2022 (approximate).    Past Medical History:  Diagnosis Date  . Abdominal pain, epigastric   . Anxiety   . Biliary colic   . Depression   . 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 Gastritis   . Hypothyroidism   . Inspiratory stridor 02/22/2017  . Seasonal allergies   . Wears contact lenses     Past Surgical History:  Procedure Laterality Date  . APPENDECTOMY  02/22/2013   Dr. Burt Knack  . CHOLECYSTECTOMY N/A 04/25/2015   Procedure: LAPAROSCOPIC CHOLECYSTECTOMY ;  Surgeon: Florene Glen, MD;  Location: [facility];  Service: General;  Laterality: N/A;  laparoscopic cholecystectomy  . ESOPHAGOGASTRODUODENOSCOPY (EGD) WITH PROPOFOL N/A 04/18/2015   Procedure: ESOPHAGOGASTRODUODENOSCOPY (EGD) WITH PROPOFOL;  Surgeon: Lucilla Lame, MD;  Location: [facility];  Service: Endoscopy;  Laterality: N/A;  . THYROIDECTOMY Bilateral 06/11/2017   Procedure: THYROIDECTOMY;  Surgeon: Melida Quitter, MD;  Location: [facility];  Service: ENT;  Laterality: Bilateral;  . WISDOM TOOTH EXTRACTION      Family History  Problem Relation Age of Onset  . Hypertension Mother   . Anxiety disorder Mother   . Depression Mother   . Multiple sclerosis Father   . Hypertension Maternal Grandmother   . Heart disease Maternal Grandmother   . Heart disease Maternal Grandfather   . Diabetes Paternal Grandfather   . Thyroid disease Neg Hx     Social History:  reports that she has never smoked. She has never used smokeless tobacco. She reports current alcohol use. She reports that she does not use drugs.  Allergies: No Known Allergies  Medications Prior to Admission  Medication Sig Dispense Refill Last Dose   . amphetamine-dextroamphetamine (ADDERALL XR) 10 MG 24 hr capsule Take 5 mg by mouth daily.   02/12/2022  . amphetamine-dextroamphetamine (ADDERALL) 5 MG tablet Take 5 mg by mouth 2 (two) times daily with a meal.   02/12/2022  . cetirizine (ZYRTEC) 10 MG tablet Take 10 mg by mouth as needed.    Past Month  . ketorolac (TORADOL) 10 MG tablet Take 10 mg by mouth every 6 (six) hours as needed for moderate pain.   02/12/2022  . Levothyroxine Sodium 100 MCG CAPS Take 1 tablet by mouth daily.   02/13/2022  . norethindrone (AYGESTIN) 5 MG tablet Take 5 mg by mouth daily.   02/13/2022  . BD PEN NEEDLE NANO U/F 32G X 4 MM MISC 1 Dose.     . calcium-vitamin D (OSCAL 500/200 D-3) 500-200 MG-UNIT tablet Take 1 tablet by mouth 2 (two) times daily. (Patient taking differently: Take 1 tablet by mouth as needed.) 180 tablet 3   . ibuprofen (ADVIL,MOTRIN) 200 MG tablet Take 400 mg by mouth every 8 (eight) hours as needed (for pain/headaches.).   More than a month  . Norgestimate-Ethinyl Estradiol Triphasic 0.18/0.215/0.25 MG-35 MCG tablet Take 1 tablet by mouth.     . propranolol (INDERAL) 10 MG tablet Take 1 tablet by mouth 3 (three) times daily as needed.     Marland Kitchen SAXENDA 18 MG/3ML SOPN Inject 1 Dose as directed daily.       Review of Systems Denies F/C/N/V/D  Height '5\' 6"'$  (1.676 m), weight 78.1 kg, last  menstrual period 01/28/2022. Physical Exam Lungs CTA CV RRR Abdomen soft, NT Extremities no calf tenderness  Results for orders placed or performed during the hospital encounter of 02/13/22 (from the past 24 hour(s))  Pregnancy, urine POC     Status: None   Collection Time: 02/13/22 12:07 PM  Result Value Ref Range   Preg Test, Ur NEGATIVE NEGATIVE    No results found.  Assessment/Plan: G0 with chronic pelvic pain presenting for dx laparoscopy and chromopertubation.  Risks benefits and alternatives discussed with the patient including but not limited to bleeding infection and injury.  Questions answered  and consent signed and witnessed.  Delice Lesch 02/13/2022, 12:55 PM

## 2022-02-13 NOTE — Op Note (Signed)
Preop Diagnosis: PELVIC PAIN  Postop Diagnosis: 1.PELVIC PAIN 2.ENDOMETRIOSIS 3.ADHESIONS  Procedure: 1.DIAGNOSTIC LAPAROSCOPY 2.PERITONEAL BIOPSIES 3.CHROMOPERTUBATION 4.LOA  Anesthesia: Choice   Anesthesiologist: Brennan Bailey, MD   Attending: Everett Graff, MD   Assistant:  Crawford Givens, MD  Findings: Powder burn like implants throughout posterior cul-de-sac as well as leukoplakia appearing implants  Pathology: 1.Peritoneal punch biopsies x 2 2.1cm piece of peritoneum  Fluids: 1100 cc  UOP: 75 cc  EBL: 10 cc  Complications: None  Procedure:  The patient was taken to the operating room after the risks, benefits, alternatives, complications, treatment options, and expected outcomes were discussed with the patient. The patient verbalized understanding, the patient concurred with the proposed plan and consent signed and witnessed. The patient was taken to the Operating Room and identified as Karen Chandler and the procedure verified as Dx Laparoscopy and Chromopertubation.  The patient was placed under general anesthesia per anesthesia staff, the patient was placed in modified dorsal lithotomy position and was prepped, draped, and catheterized in the normal, sterile fashion.  A Time Out was held and the above information confirmed.  The cervix was visualized and an intrauterine manipulator was placed.  A 10 mm umbilical incision was then performed. Veress needle was passed and pneumoperitoneum was established. A 10 mm trocar was advanced into the intraabdominal cavity, the laparoscope was introduced and findings as noted above.  Patient was placed in trendelenburg and marcaine injected in the RLQ and a 5 mm incision was made and 5 mm trocar advanced into the intraabdominal cavity.  A 5 mm trocar was placed in the suprapubic area in a similar fashion.   Omental adhesions to the left pelvic side wall were incised.  Punch biopsies x 2 were sent from the posterior cul-de-sac  after tenting with the [facility] grasper.  A 1cm piece of peritoneum was sent as well from the left posterior cul-de-sac.  The two 5 mm trocars were removed and fascia repaired with 0 vicryl.  Pneumoperitoneum was relieved and umbilical trocar removed under direct visualization.  The umbilical fascia was repaired with 0 vicryl.  The 10 mm skin incision was repaired with 3-0 monocryl via a subcuticular stitch and dermabond was applied to all incisions.    Sponge, instrument, lap and needle counts were correct.  The patient tolerated the procedure well and was awaiting extubation and transfer to the recovery room.   An assistant was required due to the complexity of anatomy.

## 2022-02-13 NOTE — Anesthesia Procedure Notes (Signed)
Procedure Name: Intubation Date/Time: 02/13/2022 2:10 PM  Performed by: Bufford Spikes, CRNAPre-anesthesia Checklist: Patient identified, Emergency Drugs available, Suction available and Patient being monitored Patient Re-evaluated:Patient Re-evaluated prior to induction Oxygen Delivery Method: Circle system utilized Preoxygenation: Pre-oxygenation with 100% oxygen Induction Type: IV induction Ventilation: Mask ventilation without difficulty Laryngoscope Size: Miller and 2 Grade View: Grade II Tube type: Oral Tube size: 7.0 mm Number of attempts: 1 Airway Equipment and Method: Stylet and Oral airway Placement Confirmation: ETT inserted through vocal cords under direct vision, positive ETCO2 and breath sounds checked- equal and bilateral Secured at: 21 cm Tube secured with: Tape Dental Injury: Teeth and Oropharynx as per pre-operative assessment

## 2022-02-13 NOTE — Progress Notes (Signed)
P.O. medication given to help with discomfort.

## 2022-02-13 NOTE — Discharge Instructions (Signed)

## 2022-02-13 NOTE — Anesthesia Preprocedure Evaluation (Addendum)
Anesthesia Evaluation  Patient identified by MRN, date of birth, ID band Patient awake    Reviewed: Allergy & Precautions, NPO status , Patient's Chart, lab work & pertinent test results  History of Anesthesia Complications Negative for: history of anesthetic complications  Airway Mallampati: I  TM Distance: >3 FB Neck ROM: Full    Dental no notable dental hx.    Pulmonary neg pulmonary ROS   Pulmonary exam normal        Cardiovascular negative cardio ROS Normal cardiovascular exam     Neuro/Psych   Anxiety Depression    negative neurological ROS     GI/Hepatic negative GI ROS, Neg liver ROS,,,  Endo/Other  Hypothyroidism    Renal/GU negative Renal ROS  negative genitourinary   Musculoskeletal negative musculoskeletal ROS (+)    Abdominal   Peds  Hematology negative hematology ROS (+)   Anesthesia Other Findings Day of surgery medications reviewed with patient.  Reproductive/Obstetrics negative OB ROS endometriosis                              Anesthesia Physical Anesthesia Plan  ASA: 2  Anesthesia Plan: General   Post-op Pain Management: Tylenol PO (pre-op)*   Induction: Intravenous  PONV Risk Score and Plan: 3 and Treatment may vary due to age or medical condition, Ondansetron, Dexamethasone, Midazolam and Scopolamine patch - Pre-op  Airway Management Planned: Oral ETT  Additional Equipment: None  Intra-op Plan:   Post-operative Plan: Extubation in OR  Informed Consent: I have reviewed the patients History and Physical, chart, labs and discussed the procedure including the risks, benefits and alternatives for the proposed anesthesia with the patient or authorized representative who has indicated his/her understanding and acceptance.     Dental advisory given  Plan Discussed with: CRNA  Anesthesia Plan Comments:         Anesthesia Quick Evaluation

## 2022-02-13 NOTE — Transfer of Care (Signed)
Immediate Anesthesia Transfer of Care Note  Patient: Karen Chandler  Procedure(s) Performed: Procedure(s) (LRB): LAPAROSCOPY DIAGNOSTIC/CHROMOPERTUBATION (N/A)  Patient Location: PACU  Anesthesia Type: General  Level of Consciousness: awake, oriented, sedated and patient cooperative  Airway & Oxygen Therapy: Patient Spontanous Breathing and Patient connected to face mask oxygen  Post-op Assessment: Report given to PACU RN and Post -op Vital signs reviewed and stable  Post vital signs: Reviewed and stable  Complications: No apparent anesthesia complications Last Vitals:  Vitals Value Taken Time  BP 114/52 02/13/22 1530  Temp 36.4 C 02/13/22 1530  Pulse 83 02/13/22 1531  Resp 21 02/13/22 1531  SpO2 100 % 02/13/22 1531  Vitals shown include unvalidated device data.  Last Pain:  Vitals:   02/13/22 1218  PainSc: 6       Patients Stated Pain Goal: 4 (87/57/97 2820)  Complications: No notable events documented.

## 2022-02-16 ENCOUNTER — Encounter (HOSPITAL_BASED_OUTPATIENT_CLINIC_OR_DEPARTMENT_OTHER): Payer: Self-pay | Admitting: Obstetrics and Gynecology

## 2022-02-16 LAB — SURGICAL PATHOLOGY

## 2022-02-16 NOTE — Anesthesia Postprocedure Evaluation (Signed)
Anesthesia Post Note  Patient: Karen Chandler  Procedure(s) Performed: LAPAROSCOPY DIAGNOSTIC/CHROMOPERTUBATION (P[place])     Patient location during evaluation: PACU Anesthesia Type: General Level of consciousness: awake and alert Pain management: pain level controlled Vital Signs Assessment: post-procedure vital signs reviewed and stable Respiratory status: spontaneous breathing, nonlabored ventilation and respiratory function stable Cardiovascular status: blood pressure returned to baseline Postop Assessment: no apparent nausea or vomiting Anesthetic complications: no   No notable events documented.  Last Vitals:  Vitals:   02/13/22 1630 02/13/22 1650  BP: 116/62 (!) 108/52  Pulse: 81 74  Resp: 10 16  Temp:  36.6 C  SpO2: 100% 100%    Last Pain:  Vitals:   02/13/22 1650  PainSc: [place]
